# Patient Record
Sex: Female | Born: 1985 | Race: Black or African American | Hispanic: No | Marital: Single | Smoking: Never smoker
Health system: Southern US, Community
[De-identification: ages and names within clinical notes are randomized; demographics above are authoritative.]

## PROBLEM LIST (undated history)

## (undated) DIAGNOSIS — J45909 Unspecified asthma, uncomplicated: Secondary | ICD-10-CM

## (undated) DIAGNOSIS — T7840XA Allergy, unspecified, initial encounter: Secondary | ICD-10-CM

## (undated) HISTORY — PX: OTHER SURGICAL HISTORY: SHX169

## (undated) HISTORY — DX: Allergy, unspecified, initial encounter: T78.40XA

## (undated) HISTORY — PX: HERNIA REPAIR: SHX51

## (undated) HISTORY — PX: TONSILLECTOMY: SUR1361

---

## 2008-07-24 DIAGNOSIS — L709 Acne, unspecified: Secondary | ICD-10-CM | POA: Insufficient documentation

## 2008-11-27 DIAGNOSIS — E559 Vitamin D deficiency, unspecified: Secondary | ICD-10-CM | POA: Insufficient documentation

## 2009-12-15 ENCOUNTER — Ambulatory Visit: Payer: Self-pay | Admitting: Unknown Physician Specialty

## 2011-09-23 LAB — LIPID PANEL
Cholesterol: 141 mg/dL (ref 0–200)
HDL: 71 mg/dL — AB (ref 35–70)
LDL Cholesterol: 59 mg/dL
Triglycerides: 57 mg/dL (ref 40–160)

## 2011-09-23 LAB — HEPATIC FUNCTION PANEL
ALK PHOS: 61 U/L (ref 25–125)
ALT: 16 U/L (ref 7–35)
AST: 19 U/L (ref 13–35)
BILIRUBIN, TOTAL: 0.5 mg/dL

## 2011-09-23 LAB — BASIC METABOLIC PANEL
BUN: 12 mg/dL (ref 4–21)
Creatinine: 0.8 mg/dL (ref <=1.1)
Glucose: 76 mg/dL
Potassium: 4.1 mmol/L (ref 3.4–5.3)
SODIUM: 139 mmol/L (ref 137–147)

## 2013-05-06 ENCOUNTER — Emergency Department (HOSPITAL_COMMUNITY): Payer: BC Managed Care – PPO

## 2013-05-06 ENCOUNTER — Encounter (HOSPITAL_COMMUNITY): Payer: Self-pay | Admitting: Emergency Medicine

## 2013-05-06 ENCOUNTER — Emergency Department (HOSPITAL_COMMUNITY)
Admission: EM | Admit: 2013-05-06 | Discharge: 2013-05-06 | Disposition: A | Discharge status: Home / Self Care | Payer: BC Managed Care – PPO | Attending: Emergency Medicine | Admitting: Emergency Medicine

## 2013-05-06 DIAGNOSIS — Y9389 Activity, other specified: Secondary | ICD-10-CM | POA: Insufficient documentation

## 2013-05-06 DIAGNOSIS — S46909A Unspecified injury of unspecified muscle, fascia and tendon at shoulder and upper arm level, unspecified arm, initial encounter: Secondary | ICD-10-CM | POA: Insufficient documentation

## 2013-05-06 DIAGNOSIS — M25512 Pain in left shoulder: Secondary | ICD-10-CM

## 2013-05-06 DIAGNOSIS — Y92410 Unspecified street and highway as the place of occurrence of the external cause: Secondary | ICD-10-CM | POA: Insufficient documentation

## 2013-05-06 DIAGNOSIS — J45909 Unspecified asthma, uncomplicated: Secondary | ICD-10-CM | POA: Insufficient documentation

## 2013-05-06 DIAGNOSIS — S4980XA Other specified injuries of shoulder and upper arm, unspecified arm, initial encounter: Secondary | ICD-10-CM | POA: Insufficient documentation

## 2013-05-06 HISTORY — DX: Unspecified asthma, uncomplicated: J45.909

## 2013-05-06 MED ORDER — IBUPROFEN 200 MG PO TABS
600.0000 mg | ORAL_TABLET | Freq: Once | ORAL | Status: AC
  Administered 2013-05-06: 600 mg via ORAL
  Filled 2013-05-06: qty 3

## 2013-05-06 MED ORDER — HYDROCODONE-ACETAMINOPHEN 5-325 MG PO TABS: 1.0000 | ORAL_TABLET | ORAL | Status: DC | PRN

## 2013-05-06 MED ORDER — OXYCODONE-ACETAMINOPHEN 5-325 MG PO TABS
1.0000 | ORAL_TABLET | Freq: Once | ORAL | Status: AC
  Administered 2013-05-06: 1 via ORAL
  Filled 2013-05-06: qty 1

## 2013-05-06 MED ORDER — IBUPROFEN 800 MG PO TABS: 800.0000 mg | ORAL_TABLET | Freq: Three times a day (TID) | ORAL | Status: DC

## 2013-05-06 NOTE — ED Provider Notes (Signed)
Medical screening examination/treatment/procedure(s) were performed by non-physician practitioner and as supervising physician I was immediately available for consultation/collaboration.  EKG Interpretation   None         Charles B. Sheldon, MD 05/06/13 1534 
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
## 2013-05-06 NOTE — Discharge Instructions (Signed)
Take the prescribed medication as directed for pain.  Wear arm sling for comfort as long as desired. Return to the ED for new or worsening symptoms.
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
## 2013-05-06 NOTE — ED Notes (Signed)
Ortho paged and responded. 

## 2013-05-09 ENCOUNTER — Emergency Department: Payer: Self-pay | Admitting: Internal Medicine

## 2014-10-29 ENCOUNTER — Emergency Department
Admission: EM | Admit: 2014-10-29 | Discharge: 2014-10-29 | Disposition: A | Discharge status: Home / Self Care | Payer: No Typology Code available for payment source | Attending: Student | Admitting: Student

## 2014-10-29 ENCOUNTER — Emergency Department: Payer: No Typology Code available for payment source

## 2014-10-29 ENCOUNTER — Encounter: Payer: Self-pay | Admitting: Medical Oncology

## 2014-10-29 DIAGNOSIS — Z79899 Other long term (current) drug therapy: Secondary | ICD-10-CM | POA: Diagnosis not present

## 2014-10-29 DIAGNOSIS — Y9389 Activity, other specified: Secondary | ICD-10-CM | POA: Diagnosis not present

## 2014-10-29 DIAGNOSIS — Y92410 Unspecified street and highway as the place of occurrence of the external cause: Secondary | ICD-10-CM | POA: Diagnosis not present

## 2014-10-29 DIAGNOSIS — S161XXA Strain of muscle, fascia and tendon at neck level, initial encounter: Secondary | ICD-10-CM | POA: Insufficient documentation

## 2014-10-29 DIAGNOSIS — S29019A Strain of muscle and tendon of unspecified wall of thorax, initial encounter: Secondary | ICD-10-CM | POA: Diagnosis not present

## 2014-10-29 DIAGNOSIS — Y998 Other external cause status: Secondary | ICD-10-CM | POA: Insufficient documentation

## 2014-10-29 DIAGNOSIS — T148XXA Other injury of unspecified body region, initial encounter: Secondary | ICD-10-CM

## 2014-10-29 DIAGNOSIS — S199XXA Unspecified injury of neck, initial encounter: Secondary | ICD-10-CM | POA: Diagnosis present

## 2014-10-29 MED ORDER — CYCLOBENZAPRINE HCL 10 MG PO TABS: 10.0000 mg | ORAL_TABLET | Freq: Three times a day (TID) | ORAL | Status: DC | PRN

## 2014-10-29 MED ORDER — IBUPROFEN 800 MG PO TABS: 800.0000 mg | ORAL_TABLET | Freq: Three times a day (TID) | ORAL | Status: DC | PRN
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
## 2014-10-29 NOTE — ED Notes (Signed)
AAOx3.  Skin warm and dry.  NAD.  Family sitting at bedside.
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
## 2015-06-02 ENCOUNTER — Encounter: Payer: Self-pay | Admitting: Physician Assistant

## 2015-06-02 ENCOUNTER — Ambulatory Visit
Admission: RE | Admit: 2015-06-02 | Discharge: 2015-06-02 | Disposition: A | Discharge status: Home / Self Care | Payer: No Typology Code available for payment source | Source: Ambulatory Visit | Attending: Physician Assistant | Admitting: Physician Assistant

## 2015-06-02 ENCOUNTER — Ambulatory Visit (INDEPENDENT_AMBULATORY_CARE_PROVIDER_SITE_OTHER): Payer: PRIVATE HEALTH INSURANCE | Admitting: Physician Assistant

## 2015-06-02 VITALS — BP 120/60 | HR 97 | Temp 98.5°F | Resp 16 | Wt 189.2 lb

## 2015-06-02 DIAGNOSIS — N921 Excessive and frequent menstruation with irregular cycle: Secondary | ICD-10-CM | POA: Diagnosis present

## 2015-06-02 DIAGNOSIS — N926 Irregular menstruation, unspecified: Secondary | ICD-10-CM

## 2015-06-02 DIAGNOSIS — J309 Allergic rhinitis, unspecified: Secondary | ICD-10-CM | POA: Insufficient documentation

## 2015-06-02 DIAGNOSIS — N946 Dysmenorrhea, unspecified: Secondary | ICD-10-CM

## 2015-06-02 DIAGNOSIS — J45909 Unspecified asthma, uncomplicated: Secondary | ICD-10-CM | POA: Insufficient documentation

## 2015-06-02 DIAGNOSIS — Z309 Encounter for contraceptive management, unspecified: Secondary | ICD-10-CM | POA: Insufficient documentation

## 2015-06-02 NOTE — Patient Instructions (Addendum)
Dysmenorrhea Menstrual cramps (dysmenorrhea) are caused by the muscles of the uterus tightening (contracting) during a menstrual period. For some women, this discomfort is merely bothersome. For others, dysmenorrhea can be severe enough to interfere with everyday activities for a few days each month. Primary dysmenorrhea is menstrual cramps that last a couple of days when you start having menstrual periods or soon after. This often begins after a teenager starts having her period. As a woman gets older or has a baby, the cramps will usually lessen or disappear. Secondary dysmenorrhea begins later in life, lasts longer, and the pain may be stronger than primary dysmenorrhea. The pain may start before the period and last a few days after the period.  CAUSES  Dysmenorrhea is usually caused by an underlying problem, such as:  The tissue lining the uterus grows outside of the uterus in other areas of the body (endometriosis).  The endometrial tissue, which normally lines the uterus, is found in or grows into the muscular walls of the uterus (adenomyosis).  The pelvic blood vessels are engorged with blood just before the menstrual period (pelvic congestive syndrome).  Overgrowth of cells (polyps) in the lining of the uterus or cervix.  Falling down of the uterus (prolapse) because of loose or stretched ligaments.  Depression.  Bladder problems, infection, or inflammation.  Problems with the intestine, a tumor, or irritable bowel syndrome.  Cancer of the female organs or bladder.  A severely tipped uterus.  A very tight opening or closed cervix.  Noncancerous tumors of the uterus (fibroids).  Pelvic inflammatory disease (PID).  Pelvic scarring (adhesions) from a previous surgery.  Ovarian cyst.  An intrauterine device (IUD) used for birth control. RISK FACTORS You may be at greater risk of dysmenorrhea if:  You are younger than age 62.  You started puberty early.  You have  irregular or heavy bleeding.  You have never given birth.  You have a family history of this problem.  You are a smoker. SIGNS AND SYMPTOMS   Cramping or throbbing pain in your lower abdomen.  Headaches.  Lower back pain.  Nausea or vomiting.  Diarrhea.  Sweating or dizziness.  Loose stools. DIAGNOSIS  A diagnosis is based on your history, symptoms, physical exam, diagnostic tests, or procedures. Diagnostic tests or procedures may include:  Blood tests.  Ultrasonography.  An examination of the lining of the uterus (dilation and curettage, D&C).  An examination inside your abdomen or pelvis with a scope (laparoscopy).  X-rays.  CT scan.  MRI.  An examination inside the bladder with a scope (cystoscopy).  An examination inside the intestine or stomach with a scope (colonoscopy, gastroscopy). TREATMENT  Treatment depends on the cause of the dysmenorrhea. Treatment may include:  Pain medicine prescribed by your health care provider.  Birth control pills or an IUD with progesterone hormone in it.  Hormone replacement therapy.  Nonsteroidal anti-inflammatory drugs (NSAIDs). These may help stop the production of prostaglandins.  Surgery to remove adhesions, endometriosis, ovarian cyst, or fibroids.  Removal of the uterus (hysterectomy).  Progesterone shots to stop the menstrual period.  Cutting the nerves on the sacrum that go to the female organs (presacral neurectomy).  Electric current to the sacral nerves (sacral nerve stimulation).  Antidepressant medicine.  Psychiatric therapy, counseling, or group therapy.  Exercise and physical therapy.  Meditation and yoga therapy.  Acupuncture. HOME CARE INSTRUCTIONS   Only take over-the-counter or prescription medicines as directed by your health care provider.  Place a heating pad  or hot water bottle on your lower back or abdomen. Do not sleep with the heating pad.  Use aerobic exercises, walking,  swimming, biking, and other exercises to help lessen the cramping.  Massage to the lower back or abdomen may help.  Stop smoking.  Avoid alcohol and caffeine. SEEK MEDICAL CARE IF:   Your pain does not get better with medicine.  You have pain with sexual intercourse.  Your pain increases and is not controlled with medicines.  You have abnormal vaginal bleeding with your period.  You develop nausea or vomiting with your period that is not controlled with medicine. SEEK IMMEDIATE MEDICAL CARE IF:  You pass out.    This information is not intended to replace advice given to you by your health care provider. Make sure you discuss any questions you have with your health care provider.   Document Released: 03/14/2005 Document Revised: 11/14/2012 Document Reviewed: 08/30/2012 Elsevier Interactive Patient Education 2016 [employer]. Menorrhagia Menorrhagia is the medical term for when your menstrual periods are heavy or last longer than usual. With menorrhagia, every period you have may cause enough blood loss and cramping that you are unable to maintain your usual activities. CAUSES  In some cases, the cause of heavy periods is unknown, but a number of conditions may cause menorrhagia. Common causes include:  A problem with the hormone-producing thyroid gland (hypothyroid).  Noncancerous growths in the uterus (polyps or fibroids).  An imbalance of the estrogen and progesterone hormones.  One of your ovaries not releasing an egg during one or more months.  Side effects of having an intrauterine device (IUD).  Side effects of some medicines, such as anti-inflammatory medicines or blood thinners.  A bleeding disorder that stops your blood from clotting normally. SIGNS AND SYMPTOMS  During a normal period, bleeding lasts between 4 and 8 days. Signs that your periods are too heavy include:  You routinely have to change your pad or tampon every 1 or 2 hours because it is completely  soaked.  You pass blood clots larger than 1 inch (2.5 cm) in size.  You have bleeding for more than 7 days.  You need to use pads and tampons at the same time because of heavy bleeding.  You need to wake up to change your pads or tampons during the night.  You have symptoms of anemia, such as tiredness, fatigue, or shortness of breath. DIAGNOSIS  Your health care provider will perform a physical exam and ask you questions about your symptoms and menstrual history. Other tests may be ordered based on what the health care provider finds during the exam. These tests can include:  Blood tests. Blood tests are used to check if you are pregnant or have hormonal changes, a bleeding or thyroid disorder, low iron levels (anemia), or other problems.  Endometrial biopsy. Your health care provider takes a sample of tissue from the inside of your uterus to be examined under a microscope.  Pelvic ultrasound. This test uses sound waves to make a picture of your uterus, ovaries, and vagina. The pictures can show if you have fibroids or other growths.  Hysteroscopy. For this test, your health care provider will use a small telescope to look inside your uterus. Based on the results of your initial tests, your health care provider may recommend further testing. TREATMENT  Treatment may not be needed. If it is needed, your health care provider may recommend treatment with one or more medicines first. If these do not reduce  bleeding enough, a surgical treatment might be an option. The best treatment for you will depend on:   Whether you need to prevent pregnancy.  Your desire to have children in the future.  The cause and severity of your bleeding.  Your opinion and personal preference.  Medicines for menorrhagia may include:  Birth control methods that use hormones. These include the pill, skin patch, vaginal ring, shots that you get every 3 months, hormonal IUD, and implant. These treatments  reduce bleeding during your menstrual period.  Medicines that thicken blood and slow bleeding.  Medicines that reduce swelling, such as ibuprofen.  Medicines that contain a synthetic hormone called progestin.   Medicines that make the ovaries stop working for a short time.  You may need surgical treatment for menorrhagia if the medicines are unsuccessful. Treatment options include:  Dilation and curettage (D&C). In this procedure, your health care provider opens (dilates) your cervix and then scrapes or suctions tissue from the lining of your uterus to reduce menstrual bleeding.  Operative hysteroscopy. This procedure uses a tiny tube with a light (hysteroscope) to view your uterine cavity and can help in the surgical removal of a polyp that may be causing heavy periods.  Endometrial ablation. Through various techniques, your health care provider permanently destroys the entire lining of your uterus (endometrium). After endometrial ablation, most women have little or no menstrual flow. Endometrial ablation reduces your ability to become pregnant.  Endometrial resection. This surgical procedure uses an electrosurgical wire loop to remove the lining of the uterus. This procedure also reduces your ability to become pregnant.  Hysterectomy. Surgical removal of the uterus and cervix is a permanent procedure that stops menstrual periods. Pregnancy is not possible after a hysterectomy. This procedure requires anesthesia and hospitalization. HOME CARE INSTRUCTIONS   Only take over-the-counter or prescription medicines as directed by your health care provider. Take prescribed medicines exactly as directed. Do not change or switch medicines without consulting your health care provider.  Take any prescribed iron pills exactly as directed by your health care provider. Long-term heavy bleeding may result in low iron levels. Iron pills help replace the iron your body lost from heavy bleeding. Iron may  cause constipation. If this becomes a problem, increase the bran, fruits, and roughage in your diet.  Do not take aspirin or medicines that contain aspirin 1 week before or during your menstrual period. Aspirin may make the bleeding worse.  If you need to change your sanitary pad or tampon more than once every 2 hours, stay in bed and rest as much as possible until the bleeding stops.  Eat well-balanced meals. Eat foods high in iron. Examples are leafy green vegetables, meat, liver, eggs, and whole grain breads and cereals. Do not try to lose weight until the abnormal bleeding has stopped and your blood iron level is back to normal. SEEK MEDICAL CARE IF:   You soak through a pad or tampon every 1 or 2 hours, and this happens every time you have a period.  You need to use pads and tampons at the same time because you are bleeding so much.  You need to change your pad or tampon during the night.  You have a period that lasts for more than 8 days.  You pass clots bigger than 1 inch wide.  You have irregular periods that happen more or less often than once a month.  You feel dizzy or faint.  You feel very weak or tired.  You  feel short of breath or feel your heart is beating too fast when you exercise.  You have nausea and vomiting or diarrhea while you are taking your medicine.  You have any problems that may be related to the medicine you are taking. SEEK IMMEDIATE MEDICAL CARE IF:   You soak through 4 or more pads or tampons in 2 hours.  You have any bleeding while you are pregnant. MAKE SURE YOU:   Understand these instructions.  Will watch your condition.  Will get help right away if you are not doing well or get worse.   This information is not intended to replace advice given to you by your health care provider. Make sure you discuss any questions you have with your health care provider.   Document Released: 03/14/2005 Document Revised: 03/19/2013 Document Reviewed:  09/02/2012 Elsevier Interactive Patient Education 2016 Elsevier Inc. Uterine Fibroids Uterine fibroids are tissue masses (tumors) that can develop in the womb (uterus). They are also called leiomyomas. This type of tumor is not cancerous (benign) and does not spread to other parts of the body outside of the pelvic area, which is between the hip bones. Occasionally, fibroids may develop in the fallopian tubes, in the cervix, or on the support structures (ligaments) that surround the uterus. You can have one or many fibroids. Fibroids can vary in size, weight, and where they grow in the uterus. Some can become quite large. Most fibroids do not require medical treatment. CAUSES A fibroid can develop when a single uterine cell keeps growing (replicating). Most cells in the human body have a control mechanism that keeps them from replicating without control. SIGNS AND SYMPTOMS Symptoms may include:   Heavy bleeding during your period.  Bleeding or spotting between periods.  Pelvic pain and pressure.  Bladder problems, such as needing to urinate more often (urinary frequency) or urgently.  Inability to reproduce offspring (infertility).  Miscarriages. DIAGNOSIS Uterine fibroids are diagnosed through a physical exam. Your health care provider may feel the lumpy tumors during a pelvic exam. Ultrasonography and an MRI may be done to determine the size, location, and number of fibroids. TREATMENT Treatment may include:  Watchful waiting. This involves getting the fibroid checked by your health care provider to see if it grows or shrinks. Follow your health care provider's recommendations for how often to have this checked.  Hormone medicines. These can be taken by mouth or given through an intrauterine device (IUD).  Surgery.  Removing the fibroids (myomectomy) or the uterus (hysterectomy).  Removing blood supply to the fibroids (uterine artery embolization). If fibroids interfere with your  fertility and you want to become pregnant, your health care provider may recommend having the fibroids removed.  HOME CARE INSTRUCTIONS  Keep all follow-up visits as directed by your health care provider. This is important.  Take medicines only as directed by your health care provider.  If you were prescribed a hormone treatment, take the hormone medicines exactly as directed.  Do not take aspirin, because it can cause bleeding.  Ask your health care provider about taking iron pills and increasing the amount of dark green, leafy vegetables in your diet. These actions can help to boost your blood iron levels, which may be affected by heavy menstrual bleeding.  Pay close attention to your period and tell your health care provider about any changes, such as:  Increased blood flow that requires you to use more pads or tampons than usual per month.  A change in the number  of days that your period lasts per month.  A change in symptoms that are associated with your period, such as abdominal cramping or back pain. SEEK MEDICAL CARE IF:  You have pelvic pain, back pain, or abdominal cramps that cannot be controlled with medicines.  You have an increase in bleeding between and during periods.  You soak tampons or pads in a half hour or less.  You feel lightheaded, extra tired, or weak. SEEK IMMEDIATE MEDICAL CARE IF:  You faint.  You have a sudden increase in pelvic pain.   This information is not intended to replace advice given to you by your health care provider. Make sure you discuss any questions you have with your health care provider.   Document Released: 03/11/2000 Document Revised: 04/04/2014 Document Reviewed: 09/10/2013 Elsevier Interactive Patient Education [employer]

## 2015-06-02 NOTE — Progress Notes (Addendum)
Patient: Erin Hudson Female    DOB: Jun 11, 1985   30 y.o.   MRN: [id number] Visit Date: 06/02/2015  Today's Provider: Margaretann Loveless, PA-C   Chief Complaint  Patient presents with  . Dysmenorrhea   Subjective:    HPI  Erin Hudson is a 30yr old with c/o spotting and cramping a lot during her menstrual cycle.She started her period on Friday and it is still ongoing. She is concern about the blood clots and the heavier it was this time. She went through 36 pads Friday-Sunday. She took extra strength Tylenol and Menstrual Pain Medication. She is not currently on any contraception.  She does have a personal history of abnormal menses but never like this.  She states it is not uncommon for her to miss 2-3 months at a time.  She has been seen at [facility] by Dr. [place] and Dr. Luella Cook.  She had an IUD placed and had it removed 3 weeks later due to cramping and abnormal menses. She then tried depo-provera but did not like the way it made her feel. She was then placed on OCPs but discontinued due to nausea.   She does have a positive family history of fibroids in her maternal aunt, who had a hysterectomy.  She also states her mother had similar issues and bled constantly and also had a hysterectomy (never diagnosed as fibroids).  She is tearful today as she is concerned she is going to have to have a hysterectomy and she wants children.    No Known Allergies Previous Medications   ALBUTEROL (PROVENTIL HFA;VENTOLIN HFA) 108 (90 BASE) MCG/ACT INHALER    Inhale 1 puff into the lungs every 6 (six) hours as needed for wheezing or shortness of breath.   ALBUTEROL (PROVENTIL) (2.5 MG/3ML) 0.083% NEBULIZER SOLUTION    Take 2.5 mg by nebulization every 6 (six) hours as needed for wheezing or shortness of breath.    Review of Systems  Constitutional: Negative.   HENT: Negative.   Respiratory: Negative.   Cardiovascular: Negative.   Gastrointestinal: Positive for abdominal pain (Lower  abdomen, mainly the left side.). Negative for nausea, vomiting, diarrhea, constipation, blood in stool and anal bleeding.  Genitourinary: Positive for vaginal bleeding, menstrual problem and pelvic pain. Negative for dysuria, frequency, hematuria, flank pain, vaginal discharge, enuresis, genital sores, vaginal pain and dyspareunia.  Musculoskeletal: Positive for back pain. Negative for myalgias, joint swelling, arthralgias and gait problem.  Neurological: Negative.     Social History  Substance Use Topics  . Smoking status: Never Smoker   . Smokeless tobacco: Not on file  . Alcohol Use: Yes     Comment: OCC   Objective:   BP 120/60 mmHg  Pulse 97  Temp(Src) 98.5 F (36.9 C) (Oral)  Resp 16  Wt 189 lb 3.2 oz (85.821 kg)  LMP 06/01/2015  Physical Exam  Constitutional: She is oriented to person, place, and time. She appears well-developed and well-nourished. No distress.  Cardiovascular: Normal rate, regular rhythm and normal heart sounds.  Exam reveals no gallop and no friction rub.   No murmur heard. Pulmonary/Chest: Effort normal and breath sounds normal. No respiratory distress. She has no wheezes. She has no rales.  Abdominal: Soft. Normal appearance and bowel sounds are normal. She exhibits no distension and no mass. There is no hepatosplenomegaly. There is tenderness in the suprapubic area, left upper quadrant and left lower quadrant. There is no rebound, no guarding and no CVA tenderness.  Neurological: She is alert and oriented to person, place, and time.  Skin: Skin is warm and dry. She is not diaphoretic.        Assessment & Plan:     1. Dysmenorrhea Will obtain US as below to R/O fibroids vs cyst.  Will f/u pending US results.  May consider adding OCP back depending on results and following up in 4 weeks.  If severely abnormal may consider referral to OB/GYN instead.  Also discussed that if it is fibroids there are alternative measures for treatment that do not involve  hysterectomy to preserve fertility. This helped calm her down as well. - US Transvaginal Non-OB; Future - US Pelvis Complete; Future  2. Irregular menstrual cycle See above medical treatment plan. - US Transvaginal Non-OB; Future - US Pelvis Complete; Future  3. Menorrhagia with irregular cycle See above medical treatment plan. - US Transvaginal Non-OB; Future - US Pelvis Complete; Future  *ADDENDUM: Pelvic and transvaginal US were negative for ovarian cyst or fibroids.  However, there was a slight enlargement of endometrium measuring 10mm.  Will add OCP and see her back in 4 weeks to see if any improvement noted.  If not, will refer to OB/GYN.      Margaretann LovelessJennifer M Luise Yamamoto, PA-C  [facility]

## 2015-06-03 ENCOUNTER — Telehealth: Payer: Self-pay

## 2015-06-03 ENCOUNTER — Ambulatory Visit: Payer: Self-pay | Admitting: Physician Assistant

## 2015-06-03 MED ORDER — NORETHIN ACE-ETH ESTRAD-FE 1-20 MG-MCG(24) PO TABS: 1.0000 | ORAL_TABLET | Freq: Every day | ORAL | Status: DC

## 2015-06-03 NOTE — Addendum Note (Signed)
Addended by: Margaretann LovelessBURNETTE, Saafir Abdullah M on: 06/03/2015 08:29 AM   Modules accepted: Orders

## 2015-06-03 NOTE — Telephone Encounter (Signed)
-----   Message from Margaretann LovelessJennifer M Burnette, PA-C sent at 06/03/2015  8:27 AM EST ----- Mild endometrial (lining of the uterus) thickening.  Could be secondary to menstrual cycle as this could be a normal finding during menstruation. No fibroids or ovarian cyst noted. Will add oral contraceptive and f/u in 4 weeks with me to see if any improvement with oral contraceptive.

## 2015-06-03 NOTE — Telephone Encounter (Signed)
Patient advised as directed below.  Scheduled her 4 week f/u appointment.  Thanks,  -Dmarco Baldus

## 2015-06-30 ENCOUNTER — Ambulatory Visit: Payer: Self-pay | Admitting: Physician Assistant

## 2015-12-01 ENCOUNTER — Ambulatory Visit (INDEPENDENT_AMBULATORY_CARE_PROVIDER_SITE_OTHER): Payer: PRIVATE HEALTH INSURANCE | Admitting: Family Medicine

## 2015-12-01 ENCOUNTER — Encounter: Payer: Self-pay | Admitting: Family Medicine

## 2015-12-01 VITALS — BP 140/84 | HR 60 | Temp 98.0°F | Resp 16 | Wt 196.0 lb

## 2015-12-01 DIAGNOSIS — J029 Acute pharyngitis, unspecified: Secondary | ICD-10-CM

## 2015-12-01 DIAGNOSIS — R05 Cough: Secondary | ICD-10-CM | POA: Diagnosis not present

## 2015-12-01 DIAGNOSIS — R059 Cough, unspecified: Secondary | ICD-10-CM

## 2015-12-01 MED ORDER — AZITHROMYCIN 250 MG PO TABS: ORAL_TABLET | ORAL | 0 refills | Status: DC

## 2015-12-01 MED ORDER — HYDROCODONE-HOMATROPINE 5-1.5 MG/5ML PO SYRP: 5.0000 mL | ORAL_SOLUTION | Freq: Three times a day (TID) | ORAL | 0 refills | Status: DC | PRN

## 2015-12-01 NOTE — Progress Notes (Signed)
Erin Hudson  MRN: [id number] DOB: 06/22/1985  Subjective:  HPI   The patient is a 30 year old female who presents for evaluation of sinus congestion, cough and vomiting.  She states that 1 week ago she began having sinus congestion and ear pain.  On Friday, 4 days ago it worsened.  She did however go out Friday night and had a lot to drink.  On Saturday she had vomiting about all day.  She states it may be from the alcohol, maybe from alcohol while taking Mucinex or just from being sick.  She has not however had any more vomiting since Saturday.  Her cough has gotten worse.  It is non productive.  She has been using the Albuterol inhaler but not her nebulilzer.  She denies any fever.    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  Patient Active Problem List   Diagnosis Date Noted  . Allergic rhinitis 06/02/2015  . AB (asthmatic bronchitis) 06/02/2015  . Contraception management 06/02/2015  . Dysmenorrhea 06/02/2015  . Irregular menstrual cycle 06/02/2015  . Vitamin D deficiency 11/27/2008  . Acne 07/24/2008  . Pityriasis rosea 07/24/2008  . CD (contact dermatitis) 07/16/2008    Past Medical History:  Diagnosis Date  . Asthma     Social History   Social History  . Marital status: Single    Spouse name: N/A  . Number of children: N/A  . Years of education: N/A   Occupational History  . Not on file.   Social History Main Topics  . Smoking status: Never Smoker  . Smokeless tobacco: Not on file  . Alcohol use Yes     Comment: OCC  . Drug use: No  . Sexual activity: Yes    Birth control/ protection: None   Other Topics Concern  . Not on file   Social History Narrative  . No narrative on file    Outpatient Encounter Prescriptions as of 12/01/2015  Medication Sig  . albuterol (PROVENTIL HFA;VENTOLIN HFA) 108 (90 BASE) MCG/ACT inhaler Inhale 1 puff into the lungs every 6 (six) hours as needed for wheezing or shortness of breath.  Marland Kitchen. albuterol  (PROVENTIL) (2.5 MG/3ML) 0.083% nebulizer solution Take 2.5 mg by nebulization every 6 (six) hours as needed for wheezing or shortness of breath.  . Norethindrone Acetate-Ethinyl Estrad-FE (LOESTRIN 24 FE) 1-20 MG-MCG(24) tablet Take 1 tablet by mouth daily.   No facility-administered encounter medications on file as of 12/01/2015.     No Known Allergies  Review of Systems  Constitutional: Positive for chills (Sweats) and malaise/fatigue. Negative for fever.  HENT: Positive for congestion, ear pain, sore throat (secondary to vomiting.) and tinnitus. Negative for ear discharge, hearing loss and nosebleeds.   Eyes: Positive for redness. Negative for blurred vision, double vision, photophobia, pain and discharge.  Respiratory: Positive for cough. Negative for hemoptysis, sputum production, shortness of breath and wheezing.   Cardiovascular: Positive for orthopnea (Patient has been sleeping propped up on the sofa). Negative for chest pain, palpitations, claudication, leg swelling and PND.  Gastrointestinal: Negative for abdominal pain, constipation, diarrhea, heartburn, nausea and vomiting.  Neurological: Positive for weakness. Negative for headaches.   Objective:  BP 140/84   Pulse 60   Temp 98 F (36.7 C) (Oral)   Resp 16   Wt 196 lb (88.9 kg)   BMI 28.12 kg/m   Physical Exam  Constitutional: She is oriented to person, place, and time and well-developed,  well-nourished, and in no distress.  HENT:  Head: Normocephalic and atraumatic.  Right Ear: External ear normal.  Left Ear: External ear normal.  Nose: Nose normal.  Mouth/Throat: Oropharynx is clear and moist.  Eyes: Conjunctivae and EOM are normal. Pupils are equal, round, and reactive to light.  Neck: Normal range of motion. Neck supple.  Cardiovascular: Normal rate, regular rhythm, normal heart sounds and intact distal pulses.   Pulmonary/Chest: Effort normal and breath sounds normal.  Abdominal: Soft. Bowel sounds are normal.    Neurological: She is alert and oriented to person, place, and time. Gait normal.  Skin: Skin is warm and dry.  Psychiatric: Mood, memory, affect and judgment normal.    Assessment and Plan :    1. Pharyngitis  - azithromycin (ZITHROMAX) 250 MG tablet; 2 PO on day 1, then 1 po daily until gone.  Dispense: 6 tablet; Refill: 0 - HYDROcodone-homatropine (HYCODAN) 5-1.5 MG/5ML syrup; Take 5 mLs by mouth every 8 (eight) hours as needed for cough.  Dispense: 120 mL; Refill: 0  2. Cough  - azithromycin (ZITHROMAX) 250 MG tablet; 2 PO on day 1, then 1 po daily until gone.  Dispense: 6 tablet; Refill: 0 - HYDROcodone-homatropine (HYCODAN) 5-1.5 MG/5ML syrup; Take 5 mLs by mouth every 8 (eight) hours as needed for cough.  Dispense: 120 mL; Refill: 0 3.URI HPI, Exam and A&P Transcribed under the direction and in the presence of Megan Mans., MD. Electronically Signed: Janey Greaser, RMA

## 2015-12-28 ENCOUNTER — Encounter: Payer: Self-pay | Admitting: Family Medicine

## 2015-12-28 ENCOUNTER — Ambulatory Visit (INDEPENDENT_AMBULATORY_CARE_PROVIDER_SITE_OTHER): Payer: PRIVATE HEALTH INSURANCE | Admitting: Family Medicine

## 2015-12-28 VITALS — BP 120/68 | HR 97 | Temp 98.0°F | Resp 16 | Wt 199.0 lb

## 2015-12-28 DIAGNOSIS — J069 Acute upper respiratory infection, unspecified: Secondary | ICD-10-CM | POA: Diagnosis not present

## 2015-12-28 DIAGNOSIS — J45909 Unspecified asthma, uncomplicated: Secondary | ICD-10-CM | POA: Diagnosis not present

## 2015-12-28 DIAGNOSIS — R05 Cough: Secondary | ICD-10-CM

## 2015-12-28 DIAGNOSIS — R059 Cough, unspecified: Secondary | ICD-10-CM

## 2015-12-28 MED ORDER — AZITHROMYCIN 250 MG PO TABS: ORAL_TABLET | ORAL | 0 refills | Status: AC

## 2015-12-28 MED ORDER — ALBUTEROL SULFATE (2.5 MG/3ML) 0.083% IN NEBU: 2.5000 mg | INHALATION_SOLUTION | Freq: Four times a day (QID) | RESPIRATORY_TRACT | 2 refills | Status: DC | PRN

## 2015-12-28 MED ORDER — ALBUTEROL SULFATE HFA 108 (90 BASE) MCG/ACT IN AERS: 1.0000 | INHALATION_SPRAY | Freq: Four times a day (QID) | RESPIRATORY_TRACT | 3 refills | Status: DC | PRN

## 2015-12-28 NOTE — Progress Notes (Signed)
Patient: Erin Hudson Female    DOB: 10/14/1985   30 y.o.   MRN: [id number] Visit Date: 12/28/2015  Today's Provider: Mila Merryonald Karyssa Amaral, MD   Chief Complaint  Patient presents with  . URI   Subjective:    URI   This is a new problem. The current episode started in the past 7 days (x 3 days). The problem has been gradually worsening. There has been no fever. Associated symptoms include chest pain, congestion, coughing (dry), ear pain (bilateral), headaches, a plugged ear sensation, rhinorrhea, sinus pain, sneezing and a sore throat. Pertinent negatives include no abdominal pain, diarrhea, nausea, vomiting or wheezing. She has tried inhaler use (Hycodan, Advil Cold and Sinus, antihistamine) for the symptoms. The treatment provided no relief.       No Known Allergies   Current Outpatient Prescriptions:  .  albuterol (PROVENTIL HFA;VENTOLIN HFA) 108 (90 BASE) MCG/ACT inhaler, Inhale 1 puff into the lungs every 6 (six) hours as needed for wheezing or shortness of breath., Disp: , Rfl:  .  albuterol (PROVENTIL) (2.5 MG/3ML) 0.083% nebulizer solution, Take 2.5 mg by nebulization every 6 (six) hours as needed for wheezing or shortness of breath., Disp: , Rfl:  .  HYDROcodone-homatropine (HYCODAN) 5-1.5 MG/5ML syrup, Take 5 mLs by mouth every 8 (eight) hours as needed for cough., Disp: 120 mL, Rfl: 0 .  Norethindrone Acetate-Ethinyl Estrad-FE (LOESTRIN 24 FE) 1-20 MG-MCG(24) tablet, Take 1 tablet by mouth daily., Disp: 1 Package, Rfl: 3  Review of Systems  HENT: Positive for congestion, ear pain (bilateral), rhinorrhea, sneezing and sore throat.   Respiratory: Positive for cough (dry). Negative for wheezing.   Cardiovascular: Positive for chest pain.  Gastrointestinal: Negative for abdominal pain, diarrhea, nausea and vomiting.  Neurological: Positive for headaches.    Social History  Substance Use Topics  . Smoking status: Never Smoker  . Smokeless tobacco: Not on file  . Alcohol  use Yes     Comment: OCC   Objective:   BP 120/68 (BP Location: Left Arm, Patient Position: Sitting, Cuff Size: Normal)   Pulse 97   Temp 98 F (36.7 C) (Oral)   Resp 16   Wt 199 lb (90.3 kg)   LMP 12/13/2015   SpO2 99%   BMI 28.55 kg/m   Physical Exam  General Appearance:    Alert, cooperative, no distress  HENT:   bilateral TM normal without fluid or infection, neck without nodes, neck has bilateral anterior cervical nodes enlarged, throat normal without erythema or exudate, sinuses nontender and nasal mucosa pale and congested  Eyes:    PERRL, conjunctiva/corneas clear, EOM's intact       Lungs:     Rare expiratory wheeze, no rales, respirations unlabored  Heart:    Regular rate and rhythm  Neurologic:   Awake, alert, oriented x 3. No apparent focal neurological           defect.           Assessment & Plan:     1. Cough  - albuterol (PROVENTIL HFA;VENTOLIN HFA) 108 (90 Base) MCG/ACT inhaler; Inhale 1 puff into the lungs every 6 (six) hours as needed for wheezing or shortness of breath.  Dispense: 1 Inhaler; Refill: 3  2. Upper respiratory tract infection, unspecified type   3. Asthmatic bronchitis without complication, unspecified asthma severity, unspecified whether persistent  - albuterol (PROVENTIL HFA;VENTOLIN HFA) 108 (90 Base) MCG/ACT inhaler; Inhale 1 puff into the lungs every 6 (six)  hours as needed for wheezing or shortness of breath.  Dispense: 1 Inhaler; Refill: 3 - albuterol (PROVENTIL) (2.5 MG/3ML) 0.083% nebulizer solution; Take 3 mLs (2.5 mg total) by nebulization every 6 (six) hours as needed for wheezing or shortness of breath.  Dispense: 75 mL; Refill: 2 - azithromycin (ZITHROMAX) 250 MG tablet; 2 by mouth today, then 1 daily for 4 days  Dispense: 6 tablet; Refill: 0  Call if symptoms change or if not rapidly improving.        The entirety of the information documented in the History of Present Illness, Review of Systems and Physical Exam were  personally obtained by me. Portions of this information were initially documented by Allene Dillon, CMA and reviewed by me for thoroughness and accuracy.     Mila Merry, MD  [facility]

## 2016-04-06 ENCOUNTER — Other Ambulatory Visit: Payer: Self-pay

## 2016-04-06 DIAGNOSIS — N926 Irregular menstruation, unspecified: Secondary | ICD-10-CM

## 2016-04-06 DIAGNOSIS — N921 Excessive and frequent menstruation with irregular cycle: Secondary | ICD-10-CM

## 2016-04-06 DIAGNOSIS — N946 Dysmenorrhea, unspecified: Secondary | ICD-10-CM

## 2016-04-06 MED ORDER — NORETHIN ACE-ETH ESTRAD-FE 1-20 MG-MCG(24) PO TABS: 1.0000 | ORAL_TABLET | Freq: Every day | ORAL | 11 refills | Status: DC

## 2016-04-06 NOTE — Telephone Encounter (Signed)
Yes - thank you

## 2016-04-06 NOTE — Telephone Encounter (Signed)
Per patient she needs refill on albuterol solution because she already went through the refills. She is also requesting refills on the Loestrin. Per Antony ContrasJenni patient needs an office visit to evaluate her breathing and to get PFT. She is aware that she has 3 more refills on the inhaler. Scheduled patient Wednesday January 17 at 1:30 pm.  Thanks,  -Joseline

## 2016-04-13 ENCOUNTER — Ambulatory Visit: Payer: Self-pay | Admitting: Physician Assistant
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
## 2016-08-06 ENCOUNTER — Other Ambulatory Visit: Payer: Self-pay | Admitting: Family Medicine

## 2016-08-06 DIAGNOSIS — J45909 Unspecified asthma, uncomplicated: Secondary | ICD-10-CM

## 2016-10-07 IMAGING — US US TRANSVAGINAL NON-OB
1 series · 13 of 25 positions shown · non-contrast
Comparison: None

CLINICAL DATA: Patient with dysmenorrhea. Left lower quadrant pain
for 5 days.

EXAM:
TRANSABDOMINAL AND TRANSVAGINAL ULTRASOUND OF PELVIS
TECHNIQUE: Both transabdominal and transvaginal ultrasound examinations of the
pelvis were performed. Transabdominal technique was performed for
global imaging of the pelvis including uterus, ovaries, adnexal
regions, and pelvic cul-de-sac. It was necessary to proceed with
endovaginal exam following the transabdominal exam to visualize the
endometrium and adnexal structures.

[Series 1: us transvaginal non-ob · 0.25mm/px · 13 of 77 slices shown]
[im 1/77]
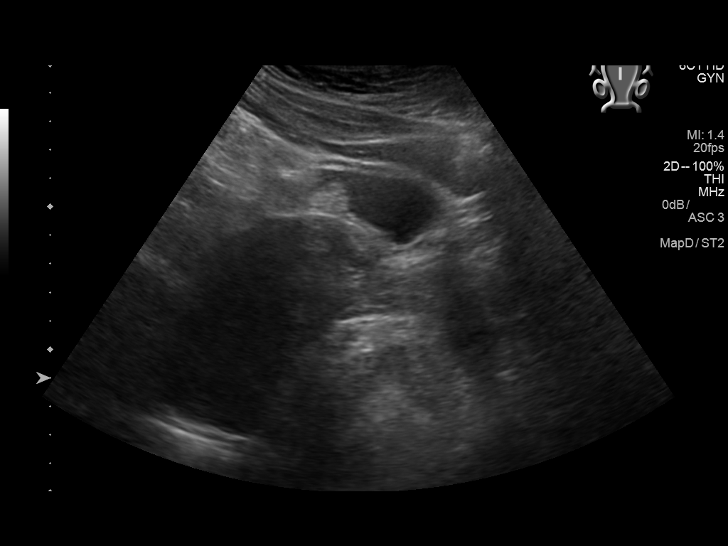
[im 7/77]
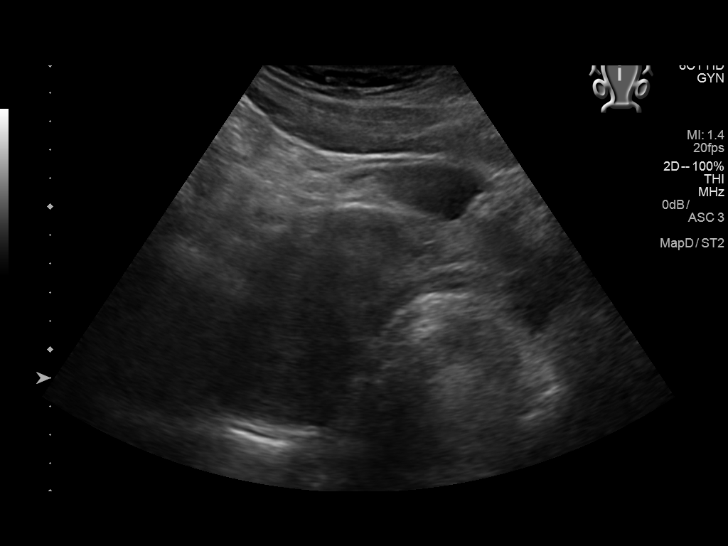
[im 13/77]
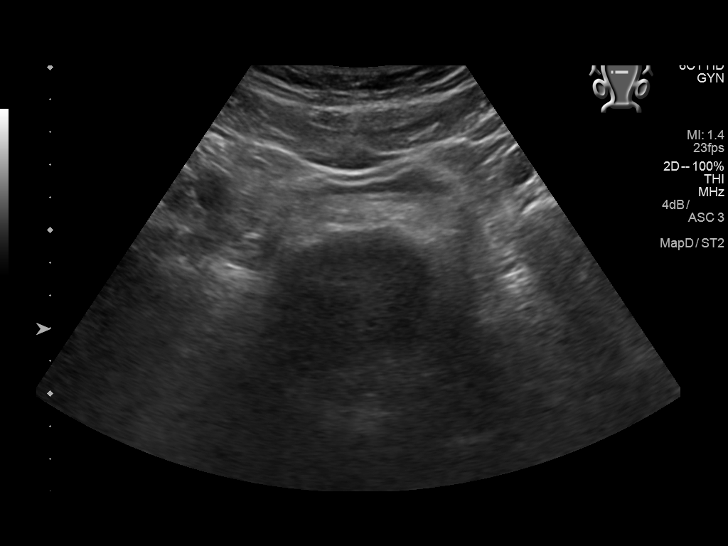
[im 20/77]
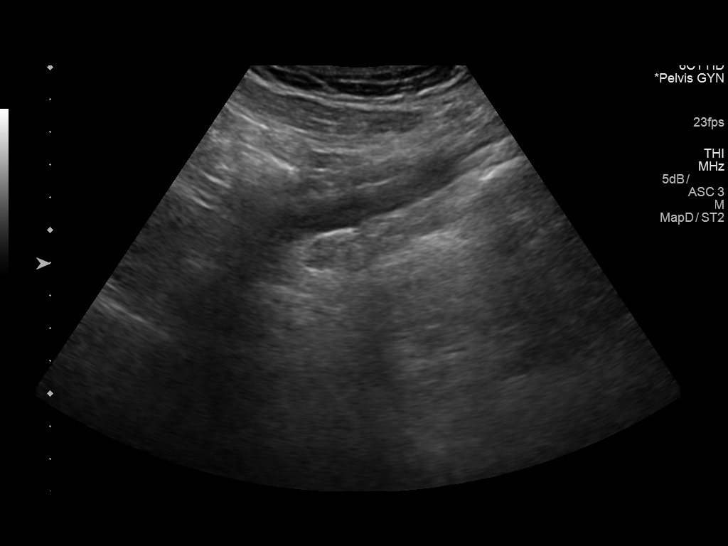
[im 26/77]
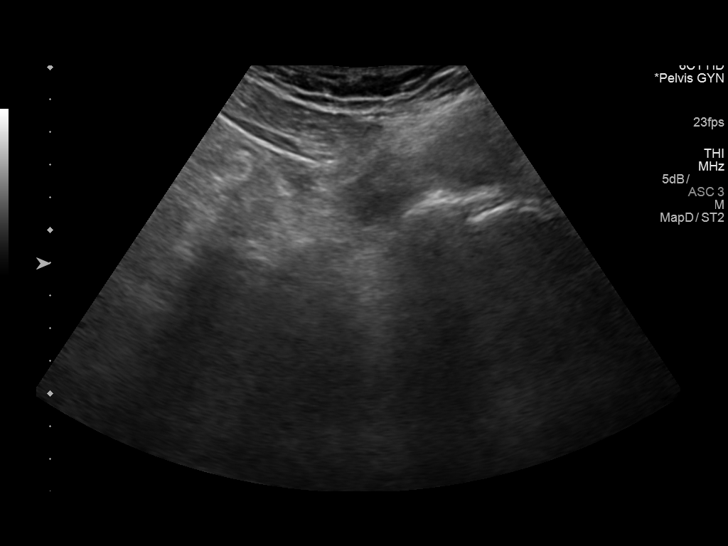
[im 32/77]
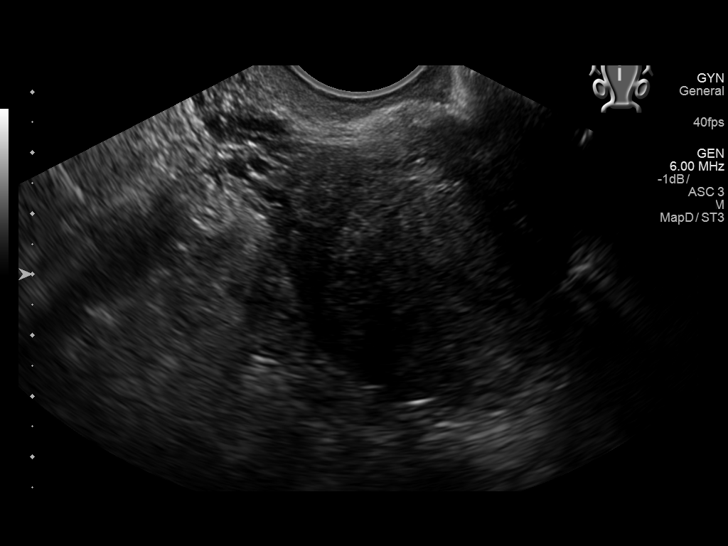
[im 39/77]
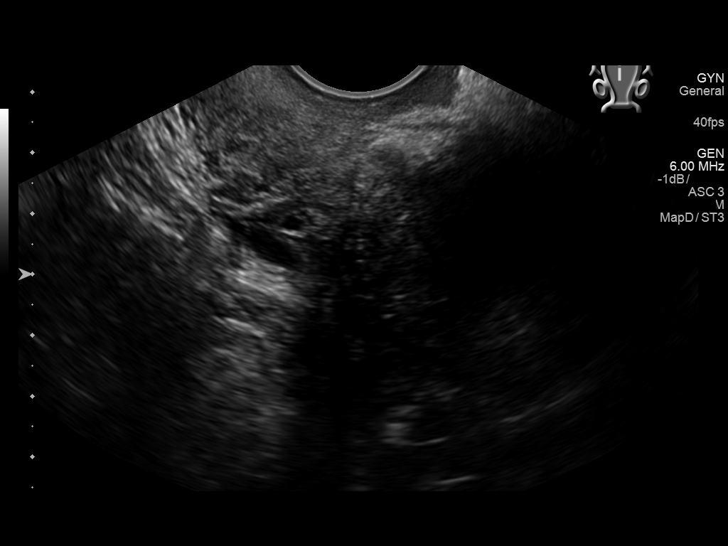
[im 45/77]
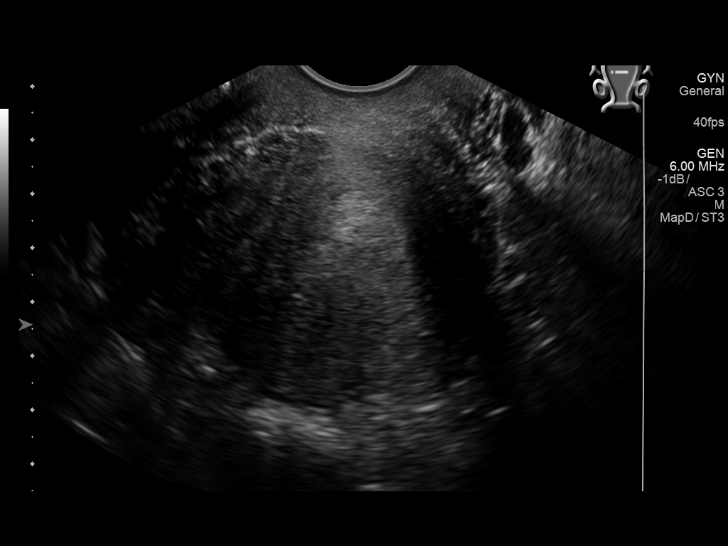
[im 51/77]
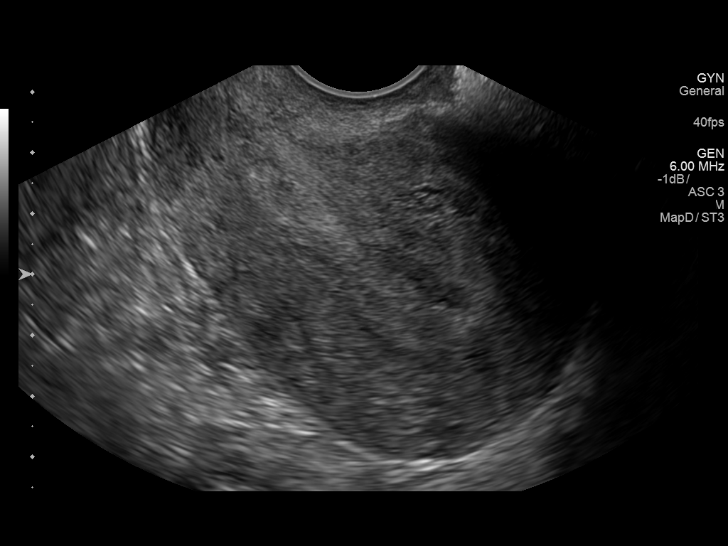
[im 58/77]
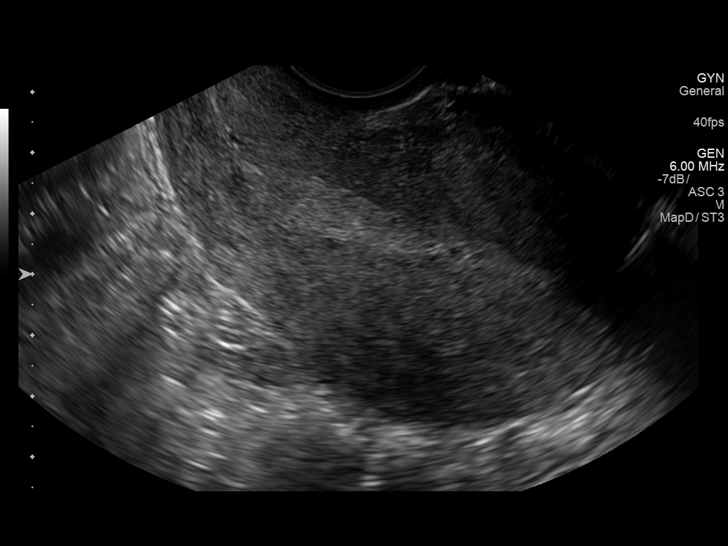
[im 64/77]
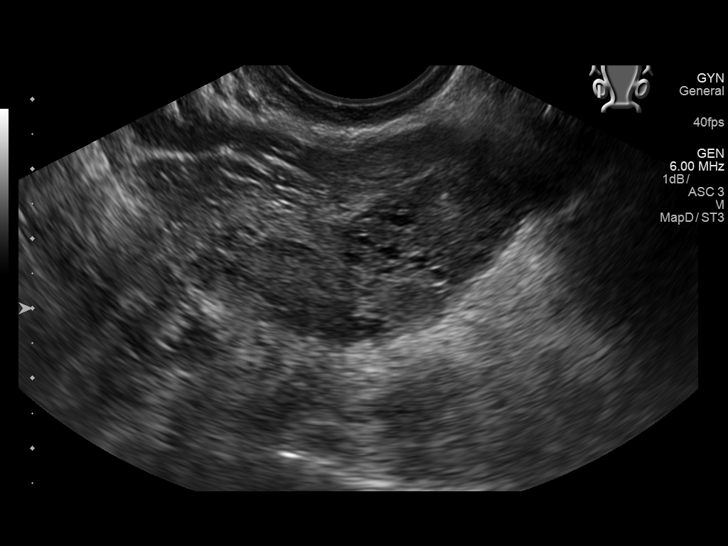
[im 70/77]
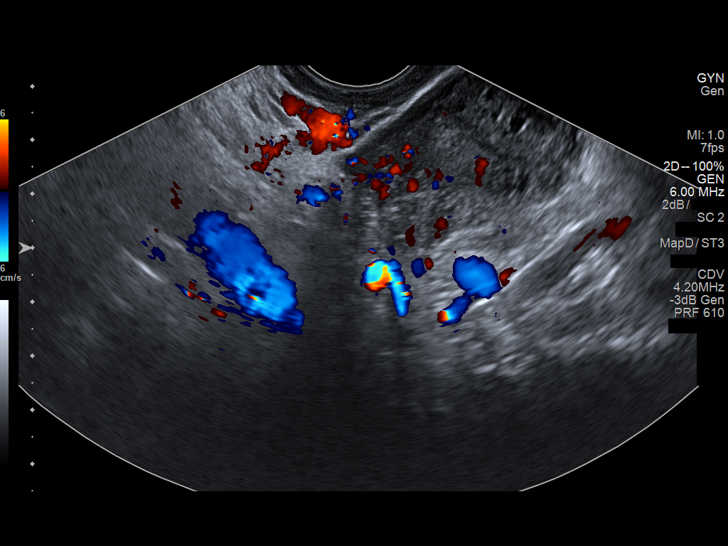
[im 77/77]
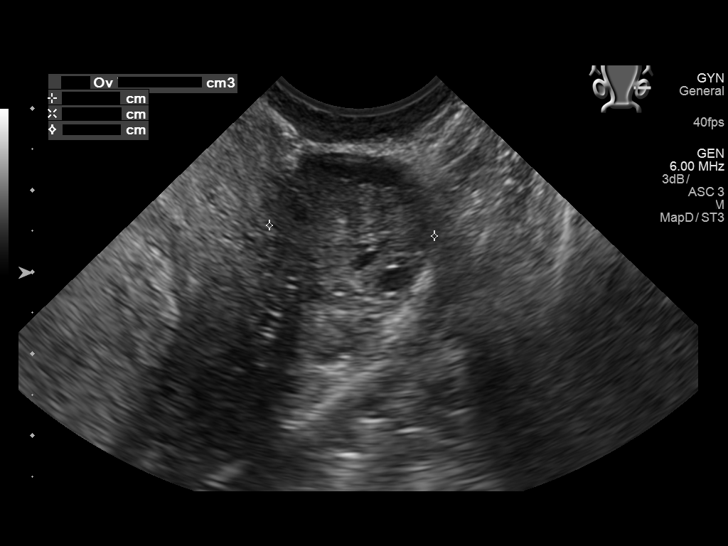

[13 of 25 positions shown; findings below may reference images not displayed]

FINDINGS: Uterus

Measurements: 9.2 x 4.8 x 5.4 cm. Retroverted.. No fibroids or other
mass visualized.

Endometrium

Thickness: 10 mm.  No focal abnormality visualized.

Right ovary

Measurements: 4.2 x 1.9 x 1.4 cm. Normal appearance/no adnexal mass.

Left ovary

Measurements: 4.5 x 2.4 x 2.0 cm. Normal appearance/no adnexal mass.

Other findings

Trace fluid in the pelvis.
IMPRESSION: Endometrium measures 10 mm. If bleeding remains unresponsive to
hormonal or medical therapy, sonohysterogram should be considered
for focal lesion work-up. (Ref: Radiological Reasoning: Algorithmic
Workup of Abnormal Vaginal Bleeding with Endovaginal Sonography and
Sonohysterography. AJR [id number]; [id number])

Normal appearing ovaries bilaterally.

## 2017-11-15 ENCOUNTER — Other Ambulatory Visit: Payer: Self-pay | Admitting: Family Medicine

## 2017-11-15 DIAGNOSIS — J45909 Unspecified asthma, uncomplicated: Secondary | ICD-10-CM

## 2017-11-16 NOTE — Telephone Encounter (Signed)
Patient was advised.patient stated she will call back to schedule an appt.

## 2017-11-16 NOTE — Telephone Encounter (Signed)
This patient has not been seen in nearly 2 years for follow up of her asthma. Have sent in prescription for one box or nebules. But she needs to schedule follow up o.v.. I think she usually sees [place]enni.

## 2018-01-01 ENCOUNTER — Encounter: Payer: Self-pay | Admitting: Physician Assistant

## 2018-01-01 ENCOUNTER — Ambulatory Visit: Payer: Self-pay | Admitting: Physician Assistant

## 2018-01-01 VITALS — BP 120/80 | HR 100 | Temp 98.5°F | Resp 16 | Wt 178.4 lb

## 2018-01-01 DIAGNOSIS — R300 Dysuria: Secondary | ICD-10-CM

## 2018-01-01 DIAGNOSIS — N3000 Acute cystitis without hematuria: Secondary | ICD-10-CM

## 2018-01-01 LAB — POCT URINALYSIS DIPSTICK
Appearance: NORMAL
Bilirubin, UA: NEGATIVE
Glucose, UA: NEGATIVE
Ketones, UA: NEGATIVE
Nitrite, UA: NEGATIVE
PH UA: 6 (ref 5.0–8.0)
PROTEIN UA: NEGATIVE
RBC UA: NEGATIVE
Spec Grav, UA: 1.01 (ref 1.010–1.025)
UROBILINOGEN UA: 0.2 U/dL

## 2018-01-01 MED ORDER — SULFAMETHOXAZOLE-TRIMETHOPRIM 800-160 MG PO TABS: 1.0000 | ORAL_TABLET | Freq: Two times a day (BID) | ORAL | 0 refills | Status: DC

## 2018-01-01 NOTE — Patient Instructions (Signed)

## 2018-01-01 NOTE — Progress Notes (Signed)
Patient: Erin Hudson Female    DOB: [birth year]   32 y.o.   MRN: [id number] Visit Date: 01/01/2018  Today's Provider: Margaretann Loveless, PA-C   Chief Complaint  Patient presents with  . Urinary Tract Infection   Subjective:    Urinary Tract Infection   This is a new problem. The current episode started in the past 7 days (Reports it started last week and took AZO on Mon and Tuesday and it helped but sxs started back yesterday). The problem occurs every urination. The problem has been gradually worsening. The quality of the pain is described as burning and aching. The pain is mild. There has been no fever. She is sexually active. There is no history of pyelonephritis. Associated symptoms include frequency and urgency. Pertinent negatives include no chills, discharge, flank pain, hematuria, nausea, possible pregnancy or vomiting. She has tried increased fluids for the symptoms. The treatment provided no relief.      No Known Allergies   Current Outpatient Medications:  .  albuterol (PROVENTIL) (2.5 MG/3ML) 0.083% nebulizer solution, USE 1 VIAL BY NEBULIZATION EVERY 6 HOURS AS NEEDED FOR WHEEZING OR SHORTNES OF BREATH, Disp: 75 mL, Rfl: 0 .  Norethindrone Acetate-Ethinyl Estrad-FE (LOESTRIN 24 FE) 1-20 MG-MCG(24) tablet, Take 1 tablet by mouth daily., Disp: 1 Package, Rfl: 11 .  HYDROcodone-homatropine (HYCODAN) 5-1.5 MG/5ML syrup, Take 5 mLs by mouth every 8 (eight) hours as needed for cough. (Patient not taking: Reported on 01/01/2018), Disp: 120 mL, Rfl: 0  Review of Systems  Constitutional: Negative for chills.  Gastrointestinal: Negative for abdominal pain, nausea and vomiting.  Genitourinary: Positive for dysuria, frequency and urgency. Negative for flank pain, hematuria, vaginal discharge and vaginal pain.  Musculoskeletal: Negative for back pain.    Social History   Tobacco Use  . Smoking status: Never Smoker  . Smokeless tobacco: Never Used  Substance Use Topics  .  Alcohol use: Yes    Comment: OCC   Objective:   BP 120/80 (BP Location: Left Arm, Patient Position: Sitting, Cuff Size: Normal)   Pulse 100   Temp 98.5 F (36.9 C) (Oral)   Resp 16   Wt 178 lb 6.4 oz (80.9 kg)   LMP 12/04/2017   SpO2 98%   BMI 25.60 kg/m  Vitals:   01/01/18 1617  BP: 120/80  Pulse: 100  Resp: 16  Temp: 98.5 F (36.9 C)  TempSrc: Oral  SpO2: 98%  Weight: 178 lb 6.4 oz (80.9 kg)     Physical Exam  Constitutional: She is oriented to person, place, and time. She appears well-developed and well-nourished. No distress.  Cardiovascular: Normal rate, regular rhythm and normal heart sounds. Exam reveals no gallop and no friction rub.  No murmur heard. Pulmonary/Chest: Effort normal and breath sounds normal. No respiratory distress. She has no wheezes. She has no rales.  Abdominal: Soft. Normal appearance and bowel sounds are normal. She exhibits no distension and no mass. There is no hepatosplenomegaly. There is tenderness in the suprapubic area. There is no rebound, no guarding and no CVA tenderness.  Neurological: She is alert and oriented to person, place, and time.  Skin: Skin is warm and dry. She is not diaphoretic.  Vitals reviewed.       Assessment & Plan:     1. Acute cystitis without hematuria Worsening symptoms. UA positive. Will treat empirically with Bactrim as below. Continue to push fluids. She is to call if symptoms do not improve or  if they worsen.  - sulfamethoxazole-trimethoprim (BACTRIM DS,SEPTRA DS) 800-160 MG tablet; Take 1 tablet by mouth 2 (two) times daily.  Dispense: 10 tablet; Refill: 0  2. Dysuria See above medical treatment plan. - POCT urinalysis dipstick - sulfamethoxazole-trimethoprim (BACTRIM DS,SEPTRA DS) 800-160 MG tablet; Take 1 tablet by mouth 2 (two) times daily.  Dispense: 10 tablet; Refill: 0       Margaretann Loveless, PA-C  [facility]

## 2018-01-09 ENCOUNTER — Other Ambulatory Visit: Payer: Self-pay | Admitting: Family Medicine

## 2018-01-09 DIAGNOSIS — J45909 Unspecified asthma, uncomplicated: Secondary | ICD-10-CM

## 2019-02-15 ENCOUNTER — Other Ambulatory Visit: Payer: Self-pay | Admitting: Family Medicine

## 2019-02-15 DIAGNOSIS — J45909 Unspecified asthma, uncomplicated: Secondary | ICD-10-CM

## 2019-02-15 NOTE — Telephone Encounter (Signed)
Requested medication (s) are due for refill today: yes  Requested medication (s) are on the active medication list: yes  Last refill: 06/28/2018  Future visit scheduled: no   Notes to clinic: overdue for office  Review for refill  Requested Prescriptions  Pending Prescriptions Disp Refills   albuterol (PROVENTIL) (2.5 MG/3ML) 0.083% nebulizer solution [Pharmacy Med Name: ALBUTEROL SUL 2.5 MG/3 ML SOLN] 75 mL 3    Sig: USE 1 VIAL BY NEBULIZATION EVERY 6 HOURS AS NEEDED FOR WHEEZING OR SHORTNES OF BREATH     Pulmonology:  Beta Agonists Failed - 02/15/2019 11:53 AM      Failed - One inhaler should last at least one month. If the patient is requesting refills earlier, contact the patient to check for uncontrolled symptoms.      Failed - Valid encounter within last 12 months    Recent Outpatient Visits          1 year ago Acute cystitis without hematuria   [facility] [place], [place], [place]   3 years ago Cough   [facility] Birdie Sons, MD   3 years ago [place] Jerrol Banana., MD   3 years ago [place], [place]

## 2020-03-24 ENCOUNTER — Other Ambulatory Visit: Payer: Self-pay | Admitting: Physician Assistant

## 2020-03-24 DIAGNOSIS — J45909 Unspecified asthma, uncomplicated: Secondary | ICD-10-CM

## 2020-03-25 ENCOUNTER — Telehealth: Payer: Self-pay | Admitting: Physician Assistant

## 2020-03-25 DIAGNOSIS — J45909 Unspecified asthma, uncomplicated: Secondary | ICD-10-CM

## 2020-03-26 NOTE — Telephone Encounter (Signed)
Pt has made an appt for next Wednesday for a med follow up and wanted to see if refill can be sent until her appt for Albuterol Sulfate (2.5 MG/3ML) 0.083% / please advise

## 2020-03-28 HISTORY — PX: INDUCED ABORTION: SHX677

## 2020-04-01 ENCOUNTER — Encounter: Payer: Self-pay | Admitting: Physician Assistant

## 2020-04-01 ENCOUNTER — Telehealth (INDEPENDENT_AMBULATORY_CARE_PROVIDER_SITE_OTHER): Payer: Self-pay | Admitting: Physician Assistant

## 2020-04-01 ENCOUNTER — Telehealth: Payer: Self-pay | Admitting: Physician Assistant

## 2020-04-01 VITALS — Ht 71.0 in | Wt 183.0 lb

## 2020-04-01 DIAGNOSIS — J45909 Unspecified asthma, uncomplicated: Secondary | ICD-10-CM

## 2020-04-01 MED ORDER — ALBUTEROL SULFATE HFA 108 (90 BASE) MCG/ACT IN AERS: 1.0000 | INHALATION_SPRAY | Freq: Four times a day (QID) | RESPIRATORY_TRACT | 3 refills | Status: DC | PRN

## 2020-04-01 MED ORDER — ALBUTEROL SULFATE (2.5 MG/3ML) 0.083% IN NEBU: INHALATION_SOLUTION | RESPIRATORY_TRACT | 3 refills | Status: DC

## 2020-04-01 NOTE — Patient Instructions (Signed)
Asthma, Adult  Asthma is a long-term (chronic) condition in which the airways get tight and narrow. The airways are the breathing passages that lead from the nose and mouth down into the lungs. A person with asthma will have times when symptoms get worse. These are called asthma attacks. They can cause coughing, whistling sounds when you breathe (wheezing), shortness of breath, and chest pain. They can make it hard to breathe. There is no cure for asthma, but medicines and lifestyle changes can help control it. There are many things that can bring on an asthma attack or make asthma symptoms worse (triggers). Common triggers include:  Mold.  Dust.  Cigarette smoke.  Cockroaches.  Things that can cause allergy symptoms (allergens). These include animal skin flakes (dander) and pollen from trees or grass.  Things that pollute the air. These may include household cleaners, wood smoke, smog, or chemical odors.  Cold air, weather changes, and wind.  Crying or laughing hard.  Stress.  Certain medicines or drugs.  Certain foods such as dried fruit, potato chips, and grape juice.  Infections, such as a cold or the flu.  Certain medical conditions or diseases.  Exercise or tiring activities. Asthma may be treated with medicines and by staying away from the things that cause asthma attacks. Types of medicines may include:  Controller medicines. These help prevent asthma symptoms. They are usually taken every day.  Fast-acting reliever or rescue medicines. These quickly relieve asthma symptoms. They are used as needed and provide short-term relief.  Allergy medicines if your attacks are brought on by allergens.  Medicines to help control the body's defense (immune) system. Follow these instructions at home: Avoiding triggers in your home  Change your heating and air conditioning filter often.  Limit your use of fireplaces and wood stoves.  Get rid of pests (such as roaches and  mice) and their droppings.  Throw away plants if you see mold on them.  Clean your floors. Dust regularly. Use cleaning products that do not smell.  Have someone vacuum when you are not home. Use a vacuum cleaner with a HEPA filter if possible.  Replace carpet with wood, tile, or vinyl flooring. Carpet can trap animal skin flakes and dust.  Use allergy-proof pillows, mattress covers, and box spring covers.  Wash bed sheets and blankets every week in hot water. Dry them in a dryer.  Keep your bedroom free of any triggers.  Avoid pets and keep windows closed when things that cause allergy symptoms are in the air.  Use blankets that are made of polyester or cotton.  Clean bathrooms and kitchens with bleach. If possible, have someone repaint the walls in these rooms with mold-resistant paint. Keep out of the rooms that are being cleaned and painted.  Wash your hands often with soap and water. If soap and water are not available, use hand sanitizer.  Do not allow anyone to smoke in your home. General instructions  Take over-the-counter and prescription medicines only as told by your doctor. ? Talk with your doctor if you have questions about how or when to take your medicines. ? Make note if you need to use your medicines more often than usual.  Do not use any products that contain nicotine or tobacco, such as cigarettes and e-cigarettes. If you need help quitting, ask your doctor.  Stay away from secondhand smoke.  Avoid doing things outdoors when allergen counts are high and when air quality is low.  Wear a ski mask   when doing outdoor activities in the winter. The mask should cover your nose and mouth. Exercise indoors on cold days if you can.  Warm up before you exercise. Take time to cool down after exercise.  Use a peak flow meter as told by your doctor. A peak flow meter is a tool that measures how well the lungs are working.  Keep track of the peak flow meter's readings.  Write them down.  Follow your asthma action plan. This is a written plan for taking care of your asthma and treating your attacks.  Make sure you get all the shots (vaccines) that your doctor recommends. Ask your doctor about a flu shot and a pneumonia shot.  Keep all follow-up visits as told by your doctor. This is important. Contact a doctor if:  You have wheezing, shortness of breath, or a cough even while taking medicine to prevent attacks.  The mucus you cough up (sputum) is thicker than usual.  The mucus you cough up changes from clear or white to yellow, green, gray, or bloody.  You have problems from the medicine you are taking, such as: ? A rash. ? Itching. ? Swelling. ? Trouble breathing.  You need reliever medicines more than 2-3 times a week.  Your peak flow reading is still at 50-79% of your personal best after following the action plan for 1 hour.  You have a fever. Get help right away if:  You seem to be worse and are not responding to medicine during an asthma attack.  You are short of breath even at rest.  You get short of breath when doing very little activity.  You have trouble eating, drinking, or talking.  You have chest pain or tightness.  You have a fast heartbeat.  Your lips or fingernails start to turn blue.  You are light-headed or dizzy, or you faint.  Your peak flow is less than 50% of your personal best.  You feel too tired to breathe normally. Summary  Asthma is a long-term (chronic) condition in which the airways get tight and narrow. An asthma attack can make it hard to breathe.  Asthma cannot be cured, but medicines and lifestyle changes can help control it.  Make sure you understand how to avoid triggers and how and when to use your medicines. This information is not intended to replace advice given to you by your health care provider. Make sure you discuss any questions you have with your health care provider. Document Revised:  05/17/2018 Document Reviewed: 04/18/2016 Elsevier Patient Education  2020 [employer]  

## 2020-04-01 NOTE — Telephone Encounter (Signed)
Does not have MyChart wants to do a Virtual Visit.  Thanks, [employer]

## 2020-04-01 NOTE — Progress Notes (Signed)
Virtual Visit via Video Note  I connected with [name] on 04/01/20 at  3:40 PM EST by a video enabled telemedicine application and verified that I am speaking with the correct person using two identifiers.  Location: Patient: Erin Hudson Provider: Home office in [place]   I discussed the limitations of evaluation and management by telemedicine and the availability of in person appointments. The patient expressed understanding and agreed to proceed.  Established patient visit   Patient: Erin Hudson   DOB: June 06, 1985   34 y.o. Female  MRN: [id number] Visit Date: 04/01/2020  Today's healthcare provider: Margaretann Loveless, PA-C   Chief Complaint  Patient presents with  . Asthma   Subjective    Asthma This is a chronic problem. Pertinent negatives include no appetite change, chest pain, dyspnea on exertion, ear congestion, ear pain, fever, headaches, heartburn, malaise/fatigue, nasal congestion, postnasal drip, rhinorrhea, trouble swallowing or weight loss. She reports complete improvement on treatment. Her past medical history is significant for asthma.   Pt is needing refills on her albuterol for her nebulizer, she is also requesting inhaler to use as needed.    Patient Active Problem List   Diagnosis Date Noted  . Allergic rhinitis 06/02/2015  . AB (asthmatic bronchitis) 06/02/2015  . Contraception management 06/02/2015  . Dysmenorrhea 06/02/2015  . Irregular menstrual cycle 06/02/2015  . Vitamin D deficiency 11/27/2008  . Acne 07/24/2008  . Pityriasis rosea 07/24/2008  . CD (contact dermatitis) 07/16/2008   Past Medical History:  Diagnosis Date  . Asthma    Social History   Tobacco Use  . Smoking status: Never Smoker  . Smokeless tobacco: Never Used  Substance Use Topics  . Alcohol use: Yes    Comment: OCC  . Drug use: No   No Known Allergies   Medications: Outpatient Medications Prior to Visit  Medication Sig  . albuterol  (PROVENTIL) (2.5 MG/3ML) 0.083% nebulizer solution USE 1 VIAL BY NEBULIZATION EVERY 6 HOURS AS NEEDED FOR WHEEZING OR SHORTNES OF BREATH  . Norethindrone Acetate-Ethinyl Estrad-FE (LOESTRIN 24 FE) 1-20 MG-MCG(24) tablet Take 1 tablet by mouth daily.  Erin Hudson HYDROcodone-homatropine (HYCODAN) 5-1.5 MG/5ML syrup Take 5 mLs by mouth every 8 (eight) hours as needed for cough.  . sulfamethoxazole-trimethoprim (BACTRIM DS,SEPTRA DS) 800-160 MG tablet Take 1 tablet by mouth 2 (two) times daily.   No facility-administered medications prior to visit.    Review of Systems  Constitutional: Negative.  Negative for appetite change, fever, malaise/fatigue and weight loss.  HENT: Negative.  Negative for ear pain, postnasal drip, rhinorrhea and trouble swallowing.   Eyes: Negative.   Respiratory: Negative.   Cardiovascular: Negative.  Negative for chest pain and dyspnea on exertion.  Gastrointestinal: Negative.  Negative for heartburn.  Neurological: Negative for dizziness, light-headedness and headaches.      Objective    Ht 5\' 11"  (1.803 m)   Wt 183 lb (83 kg)   LMP 03/02/2020   BMI 25.52 kg/m    Physical Exam Vitals reviewed.  Constitutional:      Appearance: Normal appearance. She is well-developed and well-nourished.  HENT:     Head: Normocephalic and atraumatic.  Eyes:     Extraocular Movements: EOM normal.  Pulmonary:     Effort: Pulmonary effort is normal. No respiratory distress.  Musculoskeletal:     Cervical back: Normal range of motion and neck supple.  Neurological:     Mental Status: She is alert.  Psychiatric:  Mood and Affect: Mood and affect and mood normal.        Behavior: Behavior normal.        Thought Content: Thought content normal.        Judgment: Judgment normal.      No results found for any visits on 04/01/20.  Assessment & Plan     1. Asthmatic bronchitis without complication, unspecified asthma severity, unspecified whether persistent Refilled  albuterol nebulizer and added albuterol inhaler so she can carry this to work with her. Overall does not need or use daily. Most recent flare was during weather change. Advised to call if symptoms worsen.  - albuterol (PROVENTIL) (2.5 MG/3ML) 0.083% nebulizer solution; USE 1 VIAL BY NEBULIZATION EVERY 6 HOURS AS NEEDED FOR WHEEZING OR SHORTNES OF BREATH  Dispense: 75 mL; Refill: 3 - albuterol (VENTOLIN HFA) 108 (90 Base) MCG/ACT inhaler; Inhale 1-2 puffs into the lungs every 6 (six) hours as needed for wheezing or shortness of breath.  Dispense: 18 g; Refill: 3   I discussed the assessment and treatment plan with the patient. The patient was provided an opportunity to ask questions and all were answered. The patient agreed with the plan and demonstrated an understanding of the instructions.   The patient was advised to call back or seek an in-person evaluation if the symptoms worsen or if the condition fails to improve as anticipated.  I provided 5 minutes of face-to-face time during this encounter via MyChart Video enabled encounter.   No follow-ups on file.      Delmer Islam, PA-C, have reviewed all documentation for this visit. The documentation on 04/01/20 for the exam, diagnosis, procedures, and orders are all accurate and complete.   Reine Just  [facility] [phone] (phone) [phone] (fax)  [facility]

## 2020-09-21 ENCOUNTER — Other Ambulatory Visit: Payer: Self-pay

## 2020-09-21 ENCOUNTER — Ambulatory Visit (HOSPITAL_COMMUNITY)
Admission: EM | Admit: 2020-09-21 | Discharge: 2020-09-21 | Disposition: A | Discharge status: Home / Self Care | Payer: No Typology Code available for payment source | Attending: Family Medicine | Admitting: Family Medicine

## 2020-09-21 ENCOUNTER — Encounter (HOSPITAL_COMMUNITY): Payer: Self-pay

## 2020-09-21 ENCOUNTER — Ambulatory Visit: Payer: Self-pay | Admitting: *Deleted

## 2020-09-21 DIAGNOSIS — N898 Other specified noninflammatory disorders of vagina: Secondary | ICD-10-CM | POA: Diagnosis not present

## 2020-09-21 DIAGNOSIS — R300 Dysuria: Secondary | ICD-10-CM

## 2020-09-21 LAB — POCT URINALYSIS DIPSTICK, ED / UC
Bilirubin Urine: NEGATIVE
Glucose, UA: NEGATIVE mg/dL
Nitrite: NEGATIVE
Protein, ur: 100 mg/dL — AB
Specific Gravity, Urine: >=1.03 (ref 1.005–1.030)
Urobilinogen, UA: 0.2 mg/dL (ref 0.0–1.0)
pH: 6 (ref 5.0–8.0)

## 2020-09-21 LAB — POC URINE PREG, ED: Preg Test, Ur: NEGATIVE

## 2020-09-21 MED ORDER — METRONIDAZOLE 500 MG PO TABS: 500.0000 mg | ORAL_TABLET | Freq: Two times a day (BID) | ORAL | 0 refills | Status: DC

## 2020-09-21 NOTE — ED Provider Notes (Signed)
[facility]    CSN: [id number] Arrival date & time: 09/21/20  1642      History   Chief Complaint Chief Complaint  Patient presents with   Vaginal Pain    HPI Erin Hudson is a 35 y.o. female.   Patient presenting today with 2-week history of vaginal irritation and pain, burning with urination.  She denies pelvic pain, fever, chills, discharge, vaginal odor, hematuria, nausea vomiting diarrhea, concern for STIs.  Currently on menstrual cycle for the past 4 days.  Trying Azo over-the-counter with no relief of symptoms.  States she has had this sensation once before and was told it was bacterial vaginosis, cleared with oral antibiotics.   Past Medical History:  Diagnosis Date   Asthma     Patient Active Problem List   Diagnosis Date Noted   Allergic rhinitis 06/02/2015   AB (asthmatic bronchitis) 06/02/2015   Contraception management 06/02/2015   Dysmenorrhea 06/02/2015   Irregular menstrual cycle 06/02/2015   Vitamin D deficiency 11/27/2008   Acne 07/24/2008   Pityriasis rosea 07/24/2008   CD (contact dermatitis) 07/16/2008    Past Surgical History:  Procedure Laterality Date   HERNIA REPAIR     pyloric stenosis     TONSILLECTOMY      OB History   No obstetric history on file.      Home Medications    Prior to Admission medications   Medication Sig Start Date End Date Taking? Authorizing Provider  metroNIDAZOLE (FLAGYL) 500 MG tablet Take 1 tablet (500 mg total) by mouth 2 (two) times daily. 09/21/20  Yes Particia Nearing, PA-C  albuterol (PROVENTIL) (2.5 MG/3ML) 0.083% nebulizer solution USE 1 VIAL BY NEBULIZATION EVERY 6 HOURS AS NEEDED FOR WHEEZING OR SHORTNES OF BREATH 04/01/20   Margaretann Loveless, PA-C  albuterol (VENTOLIN HFA) 108 (90 Base) MCG/ACT inhaler Inhale 1-2 puffs into the lungs every 6 (six) hours as needed for wheezing or shortness of breath. 04/01/20   Margaretann Loveless, PA-C    Family History Family History  Problem  Relation Age of Onset   Heart disease Mother    Hypertension Mother    Heart disease Father    Depression Father    Colon polyps Father    Healthy Sister     Social History Social History   Tobacco Use   Smoking status: Never   Smokeless tobacco: Never  Substance Use Topics   Alcohol use: Yes    Comment: OCC   Drug use: No     Allergies   Patient has no known allergies.   Review of Systems Review of Systems Per HPI  Physical Exam Triage Vital Signs ED Triage Vitals  Enc Vitals Group     BP 09/21/20 1713 125/68     Pulse Rate 09/21/20 1713 74     Resp 09/21/20 1713 18     Temp 09/21/20 1713 98.6 F (37 C)     Temp Source 09/21/20 1713 Oral     SpO2 09/21/20 1713 100 %     Weight --      Height --      Head Circumference --      Peak Flow --      Pain Score 09/21/20 1712 6     Pain Loc --      Pain Edu? --      Excl. in GC? --    No data found.  Updated Vital Signs BP 125/68 (BP Location: Right Arm)  Pulse 74   Temp 98.6 F (37 C) (Oral)   Resp 18   LMP 09/17/2020 (Exact Date)   SpO2 100%   Visual Acuity Right Eye Distance:   Left Eye Distance:   Bilateral Distance:    Right Eye Near:   Left Eye Near:    Bilateral Near:     Physical Exam Vitals and nursing note reviewed.  Constitutional:      Appearance: Normal appearance. She is not ill-appearing.  HENT:     Head: Atraumatic.     Mouth/Throat:     Mouth: Mucous membranes are moist.  Eyes:     Extraocular Movements: Extraocular movements intact.     Conjunctiva/sclera: Conjunctivae normal.  Cardiovascular:     Rate and Rhythm: Normal rate and regular rhythm.     Heart sounds: Normal heart sounds.  Pulmonary:     Effort: Pulmonary effort is normal.     Breath sounds: Normal breath sounds.  Abdominal:     General: Bowel sounds are normal. There is no distension.     Palpations: Abdomen is soft.     Tenderness: There is no abdominal tenderness. There is no right CVA tenderness,  left CVA tenderness or guarding.  Genitourinary:    Comments: GU exam deferred, self swab performed Musculoskeletal:        General: Normal range of motion.     Cervical back: Normal range of motion and neck supple.  Skin:    General: Skin is warm and dry.  Neurological:     Mental Status: She is alert and oriented to person, place, and time.  Psychiatric:        Mood and Affect: Mood normal.        Thought Content: Thought content normal.        Judgment: Judgment normal.   UC Treatments / Results  Labs (all labs ordered are listed, but only abnormal results are displayed) Labs Reviewed  POCT URINALYSIS DIPSTICK, ED / UC - Abnormal; Notable for the following components:      Result Value   Ketones, ur TRACE (*)    Hgb urine dipstick LARGE (*)    Protein, ur 100 (*)    Leukocytes,Ua TRACE (*)    All other components within normal limits  URINE CULTURE  POC URINE PREG, ED  CERVICOVAGINAL ANCILLARY ONLY    EKG   Radiology No results found.  Procedures Procedures (including critical care time)  Medications Ordered in UC Medications - No data to display  Initial Impression / Assessment and Plan / UC Course  I have reviewed the triage vital signs and the nursing notes.  Pertinent labs & imaging results that were available during my care of the patient were reviewed by me and considered in my medical decision making (see chart for details).     Exam and vitals completely benign, UA with hemoglobin and trace leukocytes, likely related to being on her menstrual cycle.  Urine culture and vaginal swab pending.  Given her consistent symptoms with past BV infections we will start metronidazole while awaiting results.  Discussed good vaginal hygiene, safe sexual practices.  Follow-up if symptoms worsening in the meantime.  Final Clinical Impressions(s) / UC Diagnoses   Final diagnoses:  Vaginal irritation  Dysuria   Discharge Instructions   None    ED Prescriptions      Medication Sig Dispense Auth. Provider   metroNIDAZOLE (FLAGYL) 500 MG tablet Take 1 tablet (500 mg total) by mouth 2 (two) times  daily. 14 tablet Particia Nearing, [place]      PDMP not reviewed this encounter.   Particia Nearing, [place] 09/21/20 1753

## 2020-09-21 NOTE — Telephone Encounter (Signed)
Patient is calling to report she is waiting for call about appointment from the office- due to the severity of her symptoms- advised patient - UC for evaluation and treatment.

## 2020-09-21 NOTE — ED Triage Notes (Signed)
Pt in with c/o vaginal pain x 2 weeks. Pt also c/o dysuria  Pt has taken azo with no relief

## 2020-09-21 NOTE — Telephone Encounter (Signed)
Noted  

## 2020-09-21 NOTE — Telephone Encounter (Signed)
Reason for Disposition  Urinating more frequently than usual (i.e., frequency)  Answer Assessment - Initial Assessment Questions 1. SYMPTOM: "What's the main symptom you're concerned about?" (e.g., frequency, incontinence)     Painful painful, frequency- urgency 2. ONSET: "When did the  pain/burning  start?"     2 weeks 3. PAIN: "Is there any pain?" If Yes, ask: "How bad is it?" (Scale: 1-10; mild, moderate, severe)     Yes- severe 4. CAUSE: "What do you think is causing the symptoms?"     UTI 5. OTHER SYMPTOMS: "Do you have any other symptoms?" (e.g., fever, flank pain, blood in urine, pain with urination)     Pain with urination 6. PREGNANCY: "Is there any chance you are pregnant?" "When was your last menstrual period?"     LMP- 6/23  Protocols used: Urinary Symptoms-A-AH

## 2020-09-22 LAB — CERVICOVAGINAL ANCILLARY ONLY
Bacterial Vaginitis (gardnerella): POSITIVE — AB
Candida Glabrata: NEGATIVE
Candida Vaginitis: NEGATIVE
Chlamydia: NEGATIVE
Comment: NEGATIVE
Comment: NEGATIVE
Comment: NEGATIVE
Comment: NEGATIVE
Comment: NEGATIVE
Comment: NORMAL
Neisseria Gonorrhea: NEGATIVE
Trichomonas: NEGATIVE

## 2020-09-24 LAB — URINE CULTURE: Culture: 60000 — AB

## 2020-11-06 ENCOUNTER — Ambulatory Visit (INDEPENDENT_AMBULATORY_CARE_PROVIDER_SITE_OTHER): Payer: Self-pay | Admitting: Nurse Practitioner

## 2020-11-06 ENCOUNTER — Encounter: Payer: Self-pay | Admitting: Nurse Practitioner

## 2020-11-06 ENCOUNTER — Other Ambulatory Visit: Payer: Self-pay

## 2020-11-06 VITALS — BP 139/82 | HR 88 | Temp 99.0°F | Resp 18

## 2020-11-06 DIAGNOSIS — S39012A Strain of muscle, fascia and tendon of lower back, initial encounter: Secondary | ICD-10-CM

## 2020-11-06 MED ORDER — MELOXICAM 15 MG PO TABS: 15.0000 mg | ORAL_TABLET | Freq: Every day | ORAL | 0 refills | Status: DC

## 2020-11-06 MED ORDER — CYCLOBENZAPRINE HCL 10 MG PO TABS: 10.0000 mg | ORAL_TABLET | Freq: Three times a day (TID) | ORAL | 0 refills | Status: DC | PRN

## 2020-11-06 MED ORDER — KETOROLAC TROMETHAMINE 60 MG/2ML IM SOLN
60.0000 mg | Freq: Once | INTRAMUSCULAR | Status: AC
  Administered 2020-11-06: 60 mg via INTRAMUSCULAR

## 2020-11-06 NOTE — Progress Notes (Signed)
Acute Office Visit  Subjective:    Patient ID: Erin Hudson, female    DOB: 22-Sep-1985, 34 y.o.   MRN: [id number]  Chief Complaint  Patient presents with   Back Pain    Started on Wednesday after taking the garbage out at work. Went to the gym after and is unsure if that made it worse. Pain is 6/10 today.     HPI Patient is in today for back pain that started on Wednesday after taking the garbage out at work. She went to the gym after and thinks that may have made it worse.   BACK PAIN  Duration: days Mechanism of injury:  taking the garbage out at work Location: Right and low back Onset: sudden Severity: 6/10 Quality: varies, sharp, aching, sore Frequency: constant Radiation: none Aggravating factors: lifting, movement, walking, laying, and bending Alleviating factors: rest, OTC pain patch Status: fluctuating Treatments attempted:  OTC pain patch and rest  Relief with NSAIDs?: mild Nighttime pain:  yes Paresthesias / decreased sensation:  no Bowel / bladder incontinence:  no Fevers:  no Dysuria / urinary frequency:  no   Past Medical History:  Diagnosis Date   Asthma     Past Surgical History:  Procedure Laterality Date   HERNIA REPAIR     pyloric stenosis     TONSILLECTOMY      Family History  Problem Relation Age of Onset   Heart disease Mother    Hypertension Mother    Heart disease Father    Depression Father    Colon polyps Father    Healthy Sister     Social History   Socioeconomic History   Marital status: Single    Spouse name: Not on file   Number of children: Not on file   Years of education: Not on file   Highest education level: Not on file  Occupational History   Not on file  Tobacco Use   Smoking status: Never   Smokeless tobacco: Never  Substance and Sexual Activity   Alcohol use: Yes    Comment: OCC   Drug use: No   Sexual activity: Yes    Birth control/protection: Pill  Other Topics Concern   Not on file  Social  History Narrative   Not on file   Social Determinants of Health   Financial Resource Strain: Not on file  Food Insecurity: Not on file  Transportation Needs: Not on file  Physical Activity: Not on file  Stress: Not on file  Social Connections: Not on file  Intimate Partner Violence: Not on file    Outpatient Medications Prior to Visit  Medication Sig Dispense Refill   albuterol (PROVENTIL) (2.5 MG/3ML) 0.083% nebulizer solution USE 1 VIAL BY NEBULIZATION EVERY 6 HOURS AS NEEDED FOR WHEEZING OR SHORTNES OF BREATH 75 mL 3   albuterol (VENTOLIN HFA) 108 (90 Base) MCG/ACT inhaler Inhale 1-2 puffs into the lungs every 6 (six) hours as needed for wheezing or shortness of breath. 18 g 3   metroNIDAZOLE (FLAGYL) 500 MG tablet Take 1 tablet (500 mg total) by mouth 2 (two) times daily. 14 tablet 0   No facility-administered medications prior to visit.    No Known Allergies  Review of Systems  Constitutional: Negative.   HENT: Negative.    Eyes: Negative.   Respiratory: Negative.    Cardiovascular: Negative.   Gastrointestinal: Negative.   Genitourinary: Negative.   Musculoskeletal:  Positive for back pain.  Skin: Negative.   Neurological: Negative.  Objective:    Physical Exam Vitals and nursing note reviewed.  Constitutional:      General: She is not in acute distress.    Appearance: Normal appearance.  HENT:     Head: Normocephalic.  Eyes:     Conjunctiva/sclera: Conjunctivae normal.  Musculoskeletal:        General: No swelling or tenderness.     Cervical back: Normal range of motion.     Comments: ROM to lumbar spine limited by pain  Skin:    General: Skin is warm.  Neurological:     General: No focal deficit present.     Mental Status: She is alert and oriented to person, place, and time.  Psychiatric:        Mood and Affect: Mood normal.        Behavior: Behavior normal.        Thought Content: Thought content normal.        Judgment: Judgment normal.     BP 139/82 (BP Location: Right Arm, Patient Position: Sitting)   Pulse 88   Temp 99 F (37.2 C) (Oral)   Resp 18   SpO2 97%  Wt Readings from Last 3 Encounters:  04/01/20 183 lb (83 kg)  01/01/18 178 lb 6.4 oz (80.9 kg)  12/28/15 199 lb (90.3 kg)    Health Maintenance Due  Topic Date Due   COVID-19 Vaccine (1) Never done   HIV Screening  Never done   Hepatitis C Screening  Never done   PAP SMEAR-Modifier  Never done   TETANUS/TDAP  11/21/2018   INFLUENZA VACCINE  10/26/2020    There are no preventive care reminders to display for this patient.   No results found for: TSH No results found for: WBC, HGB, HCT, MCV, PLT Lab Results  Component Value Date   NA 139 09/23/2011   K 4.1 09/23/2011   BUN 12 09/23/2011   CREATININE 0.8 09/23/2011   ALKPHOS 61 09/23/2011   AST 19 09/23/2011   ALT 16 09/23/2011   Lab Results  Component Value Date   CHOL 141 09/23/2011   Lab Results  Component Value Date   HDL 71 (A) 09/23/2011   Lab Results  Component Value Date   LDLCALC 59 09/23/2011   Lab Results  Component Value Date   TRIG 57 09/23/2011   No results found for: CHOLHDL No results found for: XFGH8E     Assessment & Plan:   Problem List Items Addressed This Visit   None Visit Diagnoses     Back strain, initial encounter    -  Primary   Treat with flexeril and meloxicam prn and toradol IM x1. Exercises given. Can continue pain patch, heat/ice, and rest. Do not go to gym until pain is better.    Relevant Medications   ketorolac (TORADOL) injection 60 mg (Completed)        Meds ordered this encounter  Medications   meloxicam (MOBIC) 15 MG tablet    Sig: Take 1 tablet (15 mg total) by mouth daily.    Dispense:  30 tablet    Refill:  0   cyclobenzaprine (FLEXERIL) 10 MG tablet    Sig: Take 1 tablet (10 mg total) by mouth 3 (three) times daily as needed for muscle spasms.    Dispense:  30 tablet    Refill:  0   ketorolac (TORADOL) injection 60 mg      Gerre Scull, NP

## 2021-02-22 ENCOUNTER — Ambulatory Visit (HOSPITAL_COMMUNITY)
Admission: EM | Admit: 2021-02-22 | Discharge: 2021-02-22 | Disposition: A | Discharge status: Home / Self Care | Payer: Self-pay | Attending: Student | Admitting: Student

## 2021-02-22 ENCOUNTER — Other Ambulatory Visit: Payer: Self-pay

## 2021-02-22 ENCOUNTER — Encounter (HOSPITAL_COMMUNITY): Payer: Self-pay

## 2021-02-22 DIAGNOSIS — J069 Acute upper respiratory infection, unspecified: Secondary | ICD-10-CM

## 2021-02-22 MED ORDER — PROMETHAZINE-DM 6.25-15 MG/5ML PO SYRP: 5.0000 mL | ORAL_SOLUTION | Freq: Four times a day (QID) | ORAL | 0 refills | Status: DC | PRN

## 2021-02-22 NOTE — Discharge Instructions (Addendum)
-  Promethazine DM cough syrup for congestion/cough. This could make you drowsy, so take at night before bed. -Mucinex during the day -You can take Tylenol up to 1000 mg 3 times daily, and ibuprofen up to 600 mg 3 times daily with food.  You can take these together, or alternate every 3-4 hours. -With a virus, you're typically contagious for 5-7 days, or as long as you're having fevers.  -Drink plenty of water!

## 2021-02-22 NOTE — ED Provider Notes (Signed)
[place]    CSN: [id number] Arrival date & time: 02/22/21  0857      History   Chief Complaint Chief Complaint  Patient presents with   URI    HPI Erin Hudson is a 35 y.o. female presenting with viral syndrome for 3 days.  Medical history asthmatic bronchitis, does not require an inhaler for this.  Describes 3 days of subjective chills and generalized body aches.  Took a Tylenol for the symptoms with minimal improvement.  Nonproductive cough.  Denies nausea, vomiting, diarrhea; she is tolerating fluids and food.  Denies known sick contacts.  HPI  Past Medical History:  Diagnosis Date   Asthma     Patient Active Problem List   Diagnosis Date Noted   Allergic rhinitis 06/02/2015   AB (asthmatic bronchitis) 06/02/2015   Contraception management 06/02/2015   Dysmenorrhea 06/02/2015   Irregular menstrual cycle 06/02/2015   Vitamin D deficiency 11/27/2008   Acne 07/24/2008   Pityriasis rosea 07/24/2008   CD (contact dermatitis) 07/16/2008    Past Surgical History:  Procedure Laterality Date   HERNIA REPAIR     pyloric stenosis     TONSILLECTOMY      OB History   No obstetric history on file.      Home Medications    Prior to Admission medications   Medication Sig Start Date End Date Taking? Authorizing Provider  promethazine-dextromethorphan (PROMETHAZINE-DM) 6.25-15 MG/5ML syrup Take 5 mLs by mouth 4 (four) times daily as needed for cough. 02/22/21  Yes Hazel Sams, PA-C  albuterol (PROVENTIL) (2.5 MG/3ML) 0.083% nebulizer solution USE 1 VIAL BY NEBULIZATION EVERY 6 HOURS AS NEEDED FOR WHEEZING OR SHORTNES OF BREATH 04/01/20   Mar Daring, PA-C  albuterol (VENTOLIN HFA) 108 (90 Base) MCG/ACT inhaler Inhale 1-2 puffs into the lungs every 6 (six) hours as needed for wheezing or shortness of breath. 04/01/20   Mar Daring, PA-C  cyclobenzaprine (FLEXERIL) 10 MG tablet Take 1 tablet (10 mg total) by mouth 3 (three) times daily as  needed for muscle spasms. 11/06/20   McElwee, Lauren A, NP  meloxicam (MOBIC) 15 MG tablet Take 1 tablet (15 mg total) by mouth daily. 11/06/20   McElwee, Scheryl Darter, NP    Family History Family History  Problem Relation Age of Onset   Heart disease Mother    Hypertension Mother    Heart disease Father    Depression Father    Colon polyps Father    Healthy Sister     Social History Social History   Tobacco Use   Smoking status: Never   Smokeless tobacco: Never  Substance Use Topics   Alcohol use: Yes    Comment: OCC   Drug use: No     Allergies   Patient has no known allergies.   Review of Systems Review of Systems  Constitutional:  Negative for appetite change, chills and fever.  HENT:  Positive for congestion. Negative for ear pain, rhinorrhea, sinus pressure, sinus pain and sore throat.   Eyes:  Negative for redness and visual disturbance.  Respiratory:  Positive for cough. Negative for chest tightness, shortness of breath and wheezing.   Cardiovascular:  Negative for chest pain and palpitations.  Gastrointestinal:  Negative for abdominal pain, constipation, diarrhea, nausea and vomiting.  Genitourinary:  Negative for dysuria, frequency and urgency.  Musculoskeletal:  Negative for myalgias.  Neurological:  Negative for dizziness, weakness and headaches.  Psychiatric/Behavioral:  Negative for confusion.   All other  systems reviewed and are negative.   Physical Exam Triage Vital Signs ED Triage Vitals  Enc Vitals Group     BP 02/22/21 1035 130/82     Pulse Rate 02/22/21 1035 90     Resp 02/22/21 1035 17     Temp 02/22/21 1035 99.5 F (37.5 C)     Temp Source 02/22/21 1035 Oral     SpO2 02/22/21 1035 98 %     Weight --      Height --      Head Circumference --      Peak Flow --      Pain Score 02/22/21 1037 6     Pain Loc --      Pain Edu? --      Excl. in [place]? --    No data found.  Updated Vital Signs BP 130/82 (BP Location: Right Arm)   Pulse 90    Temp 99.5 F (37.5 C) (Oral)   Resp 17   LMP 01/24/2021   SpO2 98%   Visual Acuity Right Eye Distance:   Left Eye Distance:   Bilateral Distance:    Right Eye Near:   Left Eye Near:    Bilateral Near:     Physical Exam Vitals reviewed.  Constitutional:      General: She is not in acute distress.    Appearance: Normal appearance. She is not ill-appearing.  HENT:     Head: Normocephalic and atraumatic.     Right Ear: Tympanic membrane, ear canal and external ear normal. No tenderness. No middle ear effusion. There is no impacted cerumen. Tympanic membrane is not perforated, erythematous, retracted or bulging.     Left Ear: Tympanic membrane, ear canal and external ear normal. No tenderness.  No middle ear effusion. There is no impacted cerumen. Tympanic membrane is not perforated, erythematous, retracted or bulging.     Nose: Nose normal. No congestion.     Mouth/Throat:     Mouth: Mucous membranes are moist.     Pharynx: Uvula midline. No oropharyngeal exudate or posterior oropharyngeal erythema.  Eyes:     Extraocular Movements: Extraocular movements intact.     Pupils: Pupils are equal, round, and reactive to light.  Cardiovascular:     Rate and Rhythm: Normal rate and regular rhythm.     Heart sounds: Normal heart sounds.  Pulmonary:     Effort: Pulmonary effort is normal.     Breath sounds: Normal breath sounds. No decreased breath sounds, wheezing, rhonchi or rales.  Abdominal:     Palpations: Abdomen is soft.     Tenderness: There is no abdominal tenderness. There is no guarding or rebound.  Lymphadenopathy:     Cervical: No cervical adenopathy.     Right cervical: No superficial cervical adenopathy.    Left cervical: No superficial cervical adenopathy.  Neurological:     General: No focal deficit present.     Mental Status: She is alert and oriented to person, place, and time.  Psychiatric:        Mood and Affect: Mood normal.        Behavior: Behavior normal.         Thought Content: Thought content normal.        Judgment: Judgment normal.     UC Treatments / Results  Labs (all labs ordered are listed, but only abnormal results are displayed) Labs Reviewed - No data to display  EKG   Radiology No results found.  Procedures Procedures (  including critical care time)  Medications Ordered in UC Medications - No data to display  Initial Impression / Assessment and Plan / UC Course  I have reviewed the triage vital signs and the nursing notes.  Pertinent labs & imaging results that were available during my care of the patient were reviewed by me and considered in my medical decision making (see chart for details).     This patient is a very pleasant 35 y.o. year old female presenting with viral URI with cough. Today this pt is afebrile nontachycardic nontachypneic, oxygenating well on room air, no wheezes rhonchi or rales.   Will defer influenza testing as she is out of the tamiflu window.  Promethazine DM sent.   Work note provided. ED return precautions discussed. Patient verbalizes understanding and agreement.      Final Clinical Impressions(s) / UC Diagnoses   Final diagnoses:  Viral upper respiratory tract infection     Discharge Instructions      -Promethazine DM cough syrup for congestion/cough. This could make you drowsy, so take at night before bed. -Mucinex during the day -You can take Tylenol up to 1000 mg 3 times daily, and ibuprofen up to 600 mg 3 times daily with food.  You can take these together, or alternate every 3-4 hours. -With a virus, you're typically contagious for 5-7 days, or as long as you're having fevers.  -Drink plenty of water!   ED Prescriptions     Medication Sig Dispense Auth. Provider   promethazine-dextromethorphan (PROMETHAZINE-DM) 6.25-15 MG/5ML syrup Take 5 mLs by mouth 4 (four) times daily as needed for cough. 118 mL Rhys Martini, PA-C      PDMP not reviewed this  encounter.   Rhys Martini, PA-C 02/22/21 1105

## 2021-02-22 NOTE — ED Triage Notes (Signed)
Pt presents with cough, congestion, and generalized body aches X 2 days.

## 2021-07-20 ENCOUNTER — Telehealth: Payer: Self-pay | Admitting: Family Medicine

## 2021-07-20 DIAGNOSIS — J9801 Acute bronchospasm: Secondary | ICD-10-CM

## 2021-07-20 DIAGNOSIS — J45909 Unspecified asthma, uncomplicated: Secondary | ICD-10-CM

## 2021-07-20 MED ORDER — ALBUTEROL SULFATE (2.5 MG/3ML) 0.083% IN NEBU: INHALATION_SOLUTION | RESPIRATORY_TRACT | 0 refills | Status: DC

## 2021-07-20 MED ORDER — PROMETHAZINE-DM 6.25-15 MG/5ML PO SYRP: 2.5000 mL | ORAL_SOLUTION | Freq: Two times a day (BID) | ORAL | 0 refills | Status: DC | PRN

## 2021-07-20 MED ORDER — ALBUTEROL SULFATE HFA 108 (90 BASE) MCG/ACT IN AERS: 1.0000 | INHALATION_SPRAY | Freq: Four times a day (QID) | RESPIRATORY_TRACT | 0 refills | Status: DC | PRN

## 2021-07-20 NOTE — Patient Instructions (Signed)
Dextromethorphan; Promethazine Solution ?What is this medication? ?DEXTROMETHORPHAN; PROMETHAZINE (dex troe meth OR fan; proe METH a zeen) treats the symptoms of the common cold, allergies, or flu. It works by reducing cough, a runny or stuffy nose, and red, itchy eyes. It is a combination of a cough suppressant and an antihistamine. ?This medicine may be used for other purposes; ask your health care provider or pharmacist if you have questions. ?COMMON BRAND NAME(S): Phen Tuss DM ?What should I tell my care team before I take this medication? ?They need to know if you have any of the following conditions: ?Blockage in your bowel ?Diabetes ?Eczema ?Glaucoma ?Heart disease ?Liver disease ?Low blood counts, like low white cell, platelet, or red cell counts ?Lung or breathing disease, like asthma ?Parkinson disease ?Prostate disease ?Seizures ?Stomach or intestine problems ?Trouble passing urine ?An unusual or allergic reaction to promethazine, dextromethorphan, other medications, foods, dyes, or preservatives ?Pregnant or trying to get pregnant ?Breast-feeding ?How should I use this medication? ?Take this medication by mouth with a glass of water. Follow the directions on the prescription label. Use a specially marked spoon or container to measure your medication. Household spoons are not accurate. Take your doses at regular intervals. Do not take your medication more often than directed. ?Talk to your care team about the use of this medication in children. Special care may be needed. Do not use this medication in children less than 60 years of age. ?Patients over 74 years old may have a stronger reaction and need a smaller dose. ?Overdosage: If you think you have taken too much of this medicine contact a poison control center or emergency room at once. ?NOTE: This medicine is only for you. Do not share this medicine with others. ?What if I miss a dose? ?If you miss a dose, take it as soon as you can. If it is almost  time for your next dose, take only that dose. Do not take double or extra doses. ?What may interact with this medication? ?Do not take this medication with any of the following: ?MAOIs like Carbex, Eldepryl, Marplan, Nardil, and Parnate ?This medication may also interact with the following: ?Alcohol or alcohol-containing products ?Antihistamines for allergy, cough, and cold ?Atropine ?Certain medications for anxiety or sleep ?Certain medications for bladder problems like oxybutynin, tolterodine ?Certain medications for depression like amitriptyline, fluoxetine, sertraline ?Certain medications for Parkinson disease like benztropine, trihexyphenidyl ?Certain medications for stomach problems like dicyclomine, hyoscyamine ?Certain medications for travel sickness like scopolamine ?Epinephrine ?General anesthetics like halothane, isoflurane, methoxyflurane, propofol ?Ipratropium ?Medications for depression, anxiety or psychotic disturbances ?Medications for high blood pressure ?Medications for seizures like phenobarbital, primidone, phenytoin ?Medications that relax muscles for surgery ?Metoclopramide ?Narcotic medications for pain ?This list may not describe all possible interactions. Give your health care provider a list of all the medicines, herbs, non-prescription drugs, or dietary supplements you use. Also tell them if you smoke, drink alcohol, or use illegal drugs. Some items may interact with your medicine. ?What should I watch for while using this medication? ?Tell your care team if your symptoms do not improve or if they get worse. ?You may get drowsy or dizzy. Do not drive, use machinery, or do anything that needs mental alertness until you know how this medication affects you. Do not stand or sit up quickly, especially if you are an older patient. This reduces the risk of dizzy or fainting spells. Alcohol may interfere with the effect of this medication. Avoid alcoholic drinks. ?This medication can  make you  more sensitive to the sun. Keep out of the sun. If you cannot avoid being in the sun, wear protective clothing and use sunscreen. Do not use sun lamps or tanning beds/booths. ?What side effects may I notice from receiving this medication? ?Side effects that you should report to your care team as soon as possible: ?Allergic reactions--skin rash, itching, hives, swelling of the face, lips, tongue, or throat ?CNS depression--slow or shallow breathing, shortness of breath, feeling faint, dizziness, confusion, trouble staying awake ?High fever, stiff muscles, increased sweating, fast or irregular heartbeat, and confusion, which may be signs of neuroleptic malignant syndrome ?Infection--fever, chills, cough, or sore throat ?Sudden eye pain or change in vision such as blurry vision, seeing halos around lights, vision loss ?Trouble passing urine ?Uncontrolled and repetitive body movements, muscle stiffness or spasms, tremors or shaking, loss of balance or coordination, restlessness, shuffling walk, which may be signs of extrapyramidal symptoms (EPS) ?Side effects that usually do not require medical attention (report to your care team if they continue or are bothersome): ?Confusion ?Constipation ?Dizziness ?Drowsiness ?Dry mouth ?Fatigue ?This list may not describe all possible side effects. Call your doctor for medical advice about side effects. You may report side effects to FDA at 1-800-FDA-1088. ?Where should I keep my medication? ?Keep out of the reach of children and pets. ?Store at room temperature between 20 and 25 degrees C (68 and 77 degrees F). Protect from light. Throw away any unused medication after the expiration date. ?NOTE: This sheet is a summary. It may not cover all possible information. If you have questions about this medicine, talk to your doctor, pharmacist, or health care provider. ?? 2023 Elsevier/Gold Standard (2020-12-24 00:00:00) ?Asthma, Adult ? ?Asthma is a long-term (chronic) condition that  causes recurrent episodes in which the lower airways in the lungs become tight and narrow. The narrowing is caused by inflammation and tightening of the smooth muscle around the lower airways. ?Asthma episodes, also called asthma attacks or asthma flares, may cause coughing, making high-pitched whistling sounds when you breathe, most often when you breathe out (wheezing), shortness of breath, and chest pain. The airways may produce extra mucus caused by the inflammation and irritation. During an attack, it can be difficult to breathe. Asthma attacks can range from minor to life-threatening. ?Asthma cannot be cured, but medicines and lifestyle changes can help control it and treat acute attacks. It is important to keep your asthma well controlled so the condition does not interfere with your daily life. ?What are the causes? ?This condition is believed to be caused by inherited (genetic) and environmental factors, but its exact cause is not known. ?What can trigger an asthma attack? ?Many things can bring on an asthma attack or make symptoms worse. These triggers are different for every person. Common triggers include: ?Allergens and irritants like mold, dust, pet dander, cockroaches, pollen, air pollution, and chemical odors. ?Cigarette smoke. ?Weather changes and cold air. ?Stress and strong emotional responses such as crying or laughing hard. ?Certain medications such as aspirin or beta blockers. ?Infections and inflammatory conditions, such as the flu, a cold, pneumonia, or inflammation of the nasal membranes (rhinitis). ?Gastroesophageal reflux disease (GERD). ?What are the signs or symptoms? ?Symptoms may occur right after exposure to an asthma trigger or hours later and can vary by person. Common signs and symptoms include: ?Wheezing. ?Trouble breathing (shortness of breath). ?Excessive nighttime or early morning coughing. ?Chest tightness. ?Tiredness (fatigue) with minimal activity. ?Difficulty talking in  complete sentences. ?  Poor exercise tolerance. ?How is this diagnosed? ?This condition is diagnosed based on: ?A physical exam and your medical history. ?Tests, which may include: ?Lung function studies to eval

## 2021-07-20 NOTE — Progress Notes (Signed)
?Virtual Visit Consent  ? ?Erin Hudson, you are scheduled for a virtual visit with a [facility] provider today.   ?  ?Just as with appointments in the office, your consent must be obtained to participate.  Your consent will be active for this visit and any virtual visit you may have with one of our providers in the next 365 days.   ?  ?If you have a MyChart account, a copy of this consent can be sent to you electronically.  All virtual visits are billed to your insurance company just like a traditional visit in the office.   ? ?As this is a virtual visit, video technology does not allow for your provider to perform a traditional examination.  This may limit your provider's ability to fully assess your condition.  If your provider identifies any concerns that need to be evaluated in person or the need to arrange testing (such as labs, EKG, etc.), we will make arrangements to do so.   ?  ?Although advances in technology are sophisticated, we cannot ensure that it will always work on either your end or our end.  If the connection with a video visit is poor, the visit may have to be switched to a telephone visit.  With either a video or telephone visit, we are not always able to ensure that we have a secure connection.    ? ?Also, by engaging in this virtual visit, you consent to the provision of healthcare. Additionally, you authorize for your insurance to be billed (if applicable) for the services provided during this visit.  ? ?I need to obtain your verbal consent now.   Are you willing to proceed with your visit today?  ?  ?ERICCA LABRA has provided verbal consent on 07/20/2021 for a virtual visit (video or telephone). ?  ?Freddy Finner, NP  ? ?Date: 07/20/2021 12:55 PM ? ? ?Virtual Visit via Video Note  ? ?I, Freddy Finner, connected with  Erin Hudson  ([id number], 12-22-1985) on 07/20/21 at  1:30 PM EDT by a video-enabled telemedicine application and verified that I am speaking with the correct person using two  identifiers. ? ?Location: ?Patient: Virtual Visit Location Patient: Home ?Provider: Virtual Visit Location Provider: Home Office ?  ?I discussed the limitations of evaluation and management by telemedicine and the availability of in person appointments. The patient expressed understanding and agreed to proceed.   ? ?History of Present Illness: ?Erin Hudson is a 36 y.o. who identifies as a female who was assigned female at birth, and is being seen today for asthma medication refill and treatment.  ? ?HPI: Asthma ?She complains of cough, sputum production and wheezing. There is no chest tightness, difficulty breathing, hoarse voice or shortness of breath. This is a chronic problem. The current episode started in the past 7 days. The problem has been waxing and waning. The cough is productive of sputum and productive. Associated symptoms include nasal congestion. Pertinent negatives include no appetite change, chest pain, dyspnea on exertion, ear congestion, ear pain, fever, headaches, PND, postnasal drip, rhinorrhea, sneezing, sore throat or trouble swallowing. Her symptoms are aggravated by change in weather. Her symptoms are alleviated by rest and OTC cough suppressant. She reports minimal improvement on treatment. Her symptoms are not alleviated by rest and OTC cough suppressant. There are no known risk factors for lung disease. Her past medical history is significant for asthma.   ?Problems:  ?Patient Active Problem List  ? Diagnosis  Date Noted  ? Allergic rhinitis 06/02/2015  ? AB (asthmatic bronchitis) 06/02/2015  ? Contraception management 06/02/2015  ? Dysmenorrhea 06/02/2015  ? Irregular menstrual cycle 06/02/2015  ? Vitamin D deficiency 11/27/2008  ? Acne 07/24/2008  ? Pityriasis rosea 07/24/2008  ? CD (contact dermatitis) 07/16/2008  ?  ?Allergies: No Known Allergies ?Medications:  ?Current Outpatient Medications:  ?  albuterol (PROVENTIL) (2.5 MG/3ML) 0.083% nebulizer solution, USE 1 VIAL BY NEBULIZATION  EVERY 6 HOURS AS NEEDED FOR WHEEZING OR SHORTNES OF BREATH, Disp: 75 mL, Rfl: 3 ?  albuterol (VENTOLIN HFA) 108 (90 Base) MCG/ACT inhaler, Inhale 1-2 puffs into the lungs every 6 (six) hours as needed for wheezing or shortness of breath., Disp: 18 g, Rfl: 3 ?  cyclobenzaprine (FLEXERIL) 10 MG tablet, Take 1 tablet (10 mg total) by mouth 3 (three) times daily as needed for muscle spasms., Disp: 30 tablet, Rfl: 0 ?  meloxicam (MOBIC) 15 MG tablet, Take 1 tablet (15 mg total) by mouth daily., Disp: 30 tablet, Rfl: 0 ?  promethazine-dextromethorphan (PROMETHAZINE-DM) 6.25-15 MG/5ML syrup, Take 5 mLs by mouth 4 (four) times daily as needed for cough., Disp: 118 mL, Rfl: 0 ? ?Observations/Objective: ?Patient is well-developed, well-nourished in no acute distress.  ?Resting comfortably  at home.  ?Head is normocephalic, atraumatic.  ?No labored breathing. Talking in complete sentences ?Speech is clear and coherent with logical content.  ?Patient is alert and oriented at baseline.  ? ? ?Assessment and Plan: ?1. Asthmatic bronchitis without complication, unspecified asthma severity, unspecified whether persistent ? ?- albuterol (PROVENTIL) (2.5 MG/3ML) 0.083% nebulizer solution; USE 1 VIAL BY NEBULIZATION EVERY 6 HOURS AS NEEDED FOR WHEEZING OR SHORTNES OF BREATH  Dispense: 75 mL; Refill: 0 ?- albuterol (VENTOLIN HFA) 108 (90 Base) MCG/ACT inhaler; Inhale 1-2 puffs into the lungs every 6 (six) hours as needed for wheezing or shortness of breath.  Dispense: 18 g; Refill: 0 ?- promethazine-dextromethorphan (PROMETHAZINE-DM) 6.25-15 MG/5ML syrup; Take 2.5 mLs by mouth 2 (two) times daily as needed for cough.  Dispense: 118 mL; Refill: 0 ? ?2. Cough due to bronchospasm ? ?- promethazine-dextromethorphan (PROMETHAZINE-DM) 6.25-15 MG/5ML syrup; Take 2.5 mLs by mouth 2 (two) times daily as needed for cough.  Dispense: 118 mL; Refill: 0 ? ?-S&S are consistent with asthma and flare ?-treatment as per above and additional prometh dm  cough syrup for allergie congestion driven bronchospasms. ?-rest, hydration, and use of albuterol meds reviewed  ?-follow up with pcp for on going refills and treatment advised  ? ? ?Follow Up Instructions: ?I discussed the assessment and treatment plan with the patient. The patient was provided an opportunity to ask questions and all were answered. The patient agreed with the plan and demonstrated an understanding of the instructions.  A copy of instructions were sent to the patient via MyChart unless otherwise noted below.  ? ? ? ?The patient was advised to call back or seek an in-person evaluation if the symptoms worsen or if the condition fails to improve as anticipated. ? ?Time:  ?I spent 15 minutes with the patient via telehealth technology discussing the above problems/concerns.   ? ?Freddy Finner, NP ? ?

## 2021-12-10 ENCOUNTER — Other Ambulatory Visit (INDEPENDENT_AMBULATORY_CARE_PROVIDER_SITE_OTHER): Payer: Self-pay | Admitting: Family Medicine

## 2021-12-10 DIAGNOSIS — J45909 Unspecified asthma, uncomplicated: Secondary | ICD-10-CM

## 2021-12-25 ENCOUNTER — Other Ambulatory Visit: Payer: Self-pay | Admitting: Family Medicine

## 2021-12-25 DIAGNOSIS — J45909 Unspecified asthma, uncomplicated: Secondary | ICD-10-CM

## 2021-12-27 ENCOUNTER — Telehealth: Payer: Self-pay | Admitting: Physician Assistant

## 2021-12-27 DIAGNOSIS — J9801 Acute bronchospasm: Secondary | ICD-10-CM

## 2021-12-27 DIAGNOSIS — J45909 Unspecified asthma, uncomplicated: Secondary | ICD-10-CM

## 2021-12-27 MED ORDER — PROMETHAZINE-DM 6.25-15 MG/5ML PO SYRP: 2.5000 mL | ORAL_SOLUTION | Freq: Two times a day (BID) | ORAL | 0 refills | Status: DC | PRN

## 2021-12-27 MED ORDER — PREDNISONE 20 MG PO TABS: 40.0000 mg | ORAL_TABLET | Freq: Every day | ORAL | 0 refills | Status: DC

## 2021-12-27 MED ORDER — ALBUTEROL SULFATE (2.5 MG/3ML) 0.083% IN NEBU: INHALATION_SOLUTION | RESPIRATORY_TRACT | 0 refills | Status: DC

## 2021-12-27 MED ORDER — ALBUTEROL SULFATE HFA 108 (90 BASE) MCG/ACT IN AERS: 1.0000 | INHALATION_SPRAY | Freq: Four times a day (QID) | RESPIRATORY_TRACT | 0 refills | Status: DC | PRN

## 2021-12-27 NOTE — Patient Instructions (Signed)
Erin Hudson, thank you for joining Margaretann Loveless, PA-C for today's virtual visit.  While this provider is not your primary care provider (PCP), if your PCP is located in our provider database this encounter information will be shared with them immediately following your visit.  Consent: (Patient) Erin Hudson provided verbal consent for this virtual visit at the beginning of the encounter.  Current Medications:  Current Outpatient Medications:    predniSONE (DELTASONE) 20 MG tablet, Take 2 tablets (40 mg total) by mouth daily with breakfast., Disp: 10 tablet, Rfl: 0   albuterol (PROVENTIL) (2.5 MG/3ML) 0.083% nebulizer solution, USE 1 VIAL BY NEBULIZATION EVERY 6 HOURS AS NEEDED FOR WHEEZING OR SHORTNES OF BREATH, Disp: 75 mL, Rfl: 0   albuterol (VENTOLIN HFA) 108 (90 Base) MCG/ACT inhaler, Inhale 1-2 puffs into the lungs every 6 (six) hours as needed for wheezing or shortness of breath., Disp: 18 g, Rfl: 0   cyclobenzaprine (FLEXERIL) 10 MG tablet, Take 1 tablet (10 mg total) by mouth 3 (three) times daily as needed for muscle spasms., Disp: 30 tablet, Rfl: 0   meloxicam (MOBIC) 15 MG tablet, Take 1 tablet (15 mg total) by mouth daily., Disp: 30 tablet, Rfl: 0   promethazine-dextromethorphan (PROMETHAZINE-DM) 6.25-15 MG/5ML syrup, Take 2.5 mLs by mouth 2 (two) times daily as needed for cough., Disp: 118 mL, Rfl: 0   Medications ordered in this encounter:  Meds ordered this encounter  Medications   albuterol (PROVENTIL) (2.5 MG/3ML) 0.083% nebulizer solution    Sig: USE 1 VIAL BY NEBULIZATION EVERY 6 HOURS AS NEEDED FOR WHEEZING OR SHORTNES OF BREATH    Dispense:  75 mL    Refill:  0    Order Specific Question:   Supervising Provider    Answer:   Merrilee Jansky [[id number]]   albuterol (VENTOLIN HFA) 108 (90 Base) MCG/ACT inhaler    Sig: Inhale 1-2 puffs into the lungs every 6 (six) hours as needed for wheezing or shortness of breath.    Dispense:  18 g    Refill:  0    Order  Specific Question:   Supervising Provider    Answer:   Merrilee Jansky [id number]   promethazine-dextromethorphan (PROMETHAZINE-DM) 6.25-15 MG/5ML syrup    Sig: Take 2.5 mLs by mouth 2 (two) times daily as needed for cough.    Dispense:  118 mL    Refill:  0    Order Specific Question:   Supervising Provider    Answer:   Merrilee Jansky [[id number]]   predniSONE (DELTASONE) 20 MG tablet    Sig: Take 2 tablets (40 mg total) by mouth daily with breakfast.    Dispense:  10 tablet    Refill:  0    Order Specific Question:   Supervising Provider    Answer:   Merrilee Jansky [[id number]]     *If you need refills on other medications prior to your next appointment, please contact your pharmacy*  Follow-Up: Call back or seek an in-person evaluation if the symptoms worsen or if the condition fails to improve as anticipated.  [facility] [phone]  Other Instructions Asthma, Adult  Asthma is a long-term (chronic) condition that causes recurrent episodes in which the lower airways in the lungs become tight and narrow. The narrowing is caused by inflammation and tightening of the smooth muscle around the lower airways. Asthma episodes, also called asthma attacks or asthma flares, may cause coughing, making high-pitched whistling sounds when you  breathe, most often when you breathe out (wheezing), shortness of breath, and chest pain. The airways may produce extra mucus caused by the inflammation and irritation. During an attack, it can be difficult to breathe. Asthma attacks can range from minor to life-threatening. Asthma cannot be cured, but medicines and lifestyle changes can help control it and treat acute attacks. It is important to keep your asthma well controlled so the condition does not interfere with your daily life. What are the causes? This condition is believed to be caused by inherited (genetic) and environmental factors, but its exact cause is not known. What can  trigger an asthma attack? Many things can bring on an asthma attack or make symptoms worse. These triggers are different for every person. Common triggers include: Allergens and irritants like mold, dust, pet dander, cockroaches, pollen, air pollution, and chemical odors. Cigarette smoke. Weather changes and cold air. Stress and strong emotional responses such as crying or laughing hard. Certain medications such as aspirin or beta blockers. Infections and inflammatory conditions, such as the flu, a cold, pneumonia, or inflammation of the nasal membranes (rhinitis). Gastroesophageal reflux disease (GERD). What are the signs or symptoms? Symptoms may occur right after exposure to an asthma trigger or hours later and can vary by person. Common signs and symptoms include: Wheezing. Trouble breathing (shortness of breath). Excessive nighttime or early morning coughing. Chest tightness. Tiredness (fatigue) with minimal activity. Difficulty talking in complete sentences. Poor exercise tolerance. How is this diagnosed? This condition is diagnosed based on: A physical exam and your medical history. Tests, which may include: Lung function studies to evaluate the flow of air in your lungs. Allergy tests. Imaging tests, such as X-rays. How is this treated? There is no cure, but symptoms can be controlled with proper treatment. Treatment usually involves: Identifying and avoiding your asthma triggers. Inhaled medicines. Two types are commonly used to treat asthma, depending on severity: Controller medicines. These help prevent asthma symptoms from occurring. They are taken every day. Fast-acting reliever or rescue medicines. These quickly relieve asthma symptoms. They are used as needed and provide short-term relief. Using other medicines, such as: Allergy medicines, such as antihistamines, if your asthma attacks are triggered by allergens. Immune medicines (immunomodulators). These are medicines  that help control the immune system. Using supplemental oxygen. This is only needed during a severe episode. Creating an asthma action plan. An asthma action plan is a written plan for managing and treating your asthma attacks. This plan includes: A list of your asthma triggers and how to avoid them. Information about when medicines should be taken and when their dosage should be changed. Instructions about using a device called a peak flow meter. A peak flow meter measures how well the lungs are working and the severity of your asthma. It helps you monitor your condition. Follow these instructions at home: Take over-the-counter and prescription medicines only as told by your health care provider. Stay up to date on all vaccinations as recommended by your healthcare provider, including vaccines for the flu and pneumonia. Use a peak flow meter and keep track of your peak flow readings. Understand and use your asthma action plan to address any asthma flares. Do not smoke or allow anyone to smoke in your home. Contact a health care provider if: You have wheezing, shortness of breath, or a cough that is not responding to medicines. Your medicines are causing side effects, such as a rash, itching, swelling, or trouble breathing. You need to use a  reliever medicine more than 2-3 times a week. Your peak flow reading is still at 50-79% of your personal best after following your action plan for 1 hour. You have a fever and shortness of breath. Get help right away if: You are getting worse and do not respond to treatment during an asthma attack. You are short of breath when at rest or when doing very little physical activity. You have difficulty eating, drinking, or talking. You have chest pain or tightness. You develop a fast heartbeat or palpitations. You have a bluish color to your lips or fingernails. You are light-headed or dizzy, or you faint. Your peak flow reading is less than 50% of your  personal best. You feel too tired to breathe normally. These symptoms may be an emergency. Get help right away. Call 911. Do not wait to see if the symptoms will go away. Do not drive yourself to the hospital. Summary Asthma is a long-term (chronic) condition that causes recurrent episodes in which the airways become tight and narrow. Asthma episodes, also called asthma attacks or asthma flares, can cause coughing, wheezing, shortness of breath, and chest pain. Asthma cannot be cured, but medicines and lifestyle changes can help keep it well controlled and prevent asthma flares. Make sure you understand how to avoid triggers and how and when to use your medicines. Asthma attacks can range from minor to life-threatening. Get help right away if you have an asthma attack and do not respond to treatment with your usual rescue medicines. This information is not intended to replace advice given to you by your health care provider. Make sure you discuss any questions you have with your health care provider. Document Revised: 12/30/2020 Document Reviewed: 12/21/2020 Elsevier Patient Education  [employer].    If you have been instructed to have an in-person evaluation today at a local Urgent Care facility, please use the link below. It will take you to a list of all of our available [facility], including address, phone number and hours of operation. Please do not delay care.  [facility]  If you or a family member do not have a primary care provider, use the link below to schedule a visit and establish care. When you choose a [facility] primary care physician or advanced practice provider, you gain a long-term partner in health. Find a Primary Care Provider  Learn more about [facility]'s in-office and virtual care options: [facility]

## 2021-12-27 NOTE — Progress Notes (Signed)
Virtual Visit Consent   Erin Hudson, you are scheduled for a virtual visit with a [facility] provider today. Just as with appointments in the office, your consent must be obtained to participate. Your consent will be active for this visit and any virtual visit you may have with one of our providers in the next 365 days. If you have a MyChart account, a copy of this consent can be sent to you electronically.  As this is a virtual visit, video technology does not allow for your provider to perform a traditional examination. This may limit your provider's ability to fully assess your condition. If your provider identifies any concerns that need to be evaluated in person or the need to arrange testing (such as labs, EKG, etc.), we will make arrangements to do so. Although advances in technology are sophisticated, we cannot ensure that it will always work on either your end or our end. If the connection with a video visit is poor, the visit may have to be switched to a telephone visit. With either a video or telephone visit, we are not always able to ensure that we have a secure connection.  By engaging in this virtual visit, you consent to the provision of healthcare and authorize for your insurance to be billed (if applicable) for the services provided during this visit. Depending on your insurance coverage, you may receive a charge related to this service.  I need to obtain your verbal consent now. Are you willing to proceed with your visit today? Erin Hudson has provided verbal consent on 12/27/2021 for a virtual visit (video or telephone). Mar Daring, PA-C  Date: 12/27/2021 8:55 AM  Virtual Visit via Video Note   I, Mar Daring, connected with  Erin Hudson  ([id number], Sep 17, 1985) on 12/27/21 at  8:45 AM EDT by a video-enabled telemedicine application and verified that I am speaking with the correct person using two identifiers.  Location: Patient: Virtual Visit Location Patient:  Home Provider: Virtual Visit Location Provider: Home Office   I discussed the limitations of evaluation and management by telemedicine and the availability of in person appointments. The patient expressed understanding and agreed to proceed.    History of Present Illness: Erin Hudson is a 36 y.o. who identifies as a female who was assigned female at birth, and is being seen today for asthma.  HPI: Asthma She complains of chest tightness, cough, shortness of breath and wheezing. There is no difficulty breathing or hoarse voice. This is a recurrent problem. The current episode started 1 to 4 weeks ago. The problem occurs 2 to 4 times per day. The problem has been gradually worsening. The cough is non-productive. Associated symptoms include chest pain (tightness), dyspnea on exertion and malaise/fatigue. Pertinent negatives include no fever, headaches, myalgias, nasal congestion, postnasal drip, rhinorrhea, sneezing or sore throat. Her symptoms are aggravated by change in weather. Her symptoms are alleviated by nothing. She reports no improvement on treatment. Her symptoms are not alleviated by beta-agonist. Her past medical history is significant for asthma.      Problems:  Patient Active Problem List   Diagnosis Date Noted   Allergic rhinitis 06/02/2015   AB (asthmatic bronchitis) 06/02/2015   Contraception management 06/02/2015   Dysmenorrhea 06/02/2015   Irregular menstrual cycle 06/02/2015   Vitamin D deficiency 11/27/2008   Acne 07/24/2008   Pityriasis rosea 07/24/2008   CD (contact dermatitis) 07/16/2008    Allergies: No Known Allergies Medications:  Current Outpatient  Medications:    predniSONE (DELTASONE) 20 MG tablet, Take 2 tablets (40 mg total) by mouth daily with breakfast., Disp: 10 tablet, Rfl: 0   albuterol (PROVENTIL) (2.5 MG/3ML) 0.083% nebulizer solution, USE 1 VIAL BY NEBULIZATION EVERY 6 HOURS AS NEEDED FOR WHEEZING OR SHORTNES OF BREATH, Disp: 75 mL, Rfl: 0    albuterol (VENTOLIN HFA) 108 (90 Base) MCG/ACT inhaler, Inhale 1-2 puffs into the lungs every 6 (six) hours as needed for wheezing or shortness of breath., Disp: 18 g, Rfl: 0   cyclobenzaprine (FLEXERIL) 10 MG tablet, Take 1 tablet (10 mg total) by mouth 3 (three) times daily as needed for muscle spasms., Disp: 30 tablet, Rfl: 0   meloxicam (MOBIC) 15 MG tablet, Take 1 tablet (15 mg total) by mouth daily., Disp: 30 tablet, Rfl: 0   promethazine-dextromethorphan (PROMETHAZINE-DM) 6.25-15 MG/5ML syrup, Take 2.5 mLs by mouth 2 (two) times daily as needed for cough., Disp: 118 mL, Rfl: 0  Observations/Objective: Patient is well-developed, well-nourished in no acute distress.  Resting comfortably at home.  Head is normocephalic, atraumatic.  No labored breathing.  Speech is clear and coherent with logical content.  Patient is alert and oriented at baseline.    Assessment and Plan: 1. Asthmatic bronchitis without complication, unspecified asthma severity, unspecified whether persistent - albuterol (PROVENTIL) (2.5 MG/3ML) 0.083% nebulizer solution; USE 1 VIAL BY NEBULIZATION EVERY 6 HOURS AS NEEDED FOR WHEEZING OR SHORTNES OF BREATH  Dispense: 75 mL; Refill: 0 - albuterol (VENTOLIN HFA) 108 (90 Base) MCG/ACT inhaler; Inhale 1-2 puffs into the lungs every 6 (six) hours as needed for wheezing or shortness of breath.  Dispense: 18 g; Refill: 0 - promethazine-dextromethorphan (PROMETHAZINE-DM) 6.25-15 MG/5ML syrup; Take 2.5 mLs by mouth 2 (two) times daily as needed for cough.  Dispense: 118 mL; Refill: 0 - predniSONE (DELTASONE) 20 MG tablet; Take 2 tablets (40 mg total) by mouth daily with breakfast.  Dispense: 10 tablet; Refill: 0  2. Cough due to bronchospasm - promethazine-dextromethorphan (PROMETHAZINE-DM) 6.25-15 MG/5ML syrup; Take 2.5 mLs by mouth 2 (two) times daily as needed for cough.  Dispense: 118 mL; Refill: 0  - Worsening over a week  - Will treat with Prednisone burst and Promethazine  DM - Albuterol inhaler and nebulizer refilled - Can continue Mucinex  - Push fluids.  - Rest.  - Steam and humidifier can help - Seek in person evaluation if worsening or symptoms fail to improve    Follow Up Instructions: I discussed the assessment and treatment plan with the patient. The patient was provided an opportunity to ask questions and all were answered. The patient agreed with the plan and demonstrated an understanding of the instructions.  A copy of instructions were sent to the patient via MyChart unless otherwise noted below.    The patient was advised to call back or seek an in-person evaluation if the symptoms worsen or if the condition fails to improve as anticipated.  Time:  I spent 10 minutes with the patient via telehealth technology discussing the above problems/concerns.    Margaretann Loveless, PA-C

## 2022-02-26 ENCOUNTER — Other Ambulatory Visit (INDEPENDENT_AMBULATORY_CARE_PROVIDER_SITE_OTHER): Payer: Self-pay | Admitting: Physician Assistant

## 2022-02-26 DIAGNOSIS — J45909 Unspecified asthma, uncomplicated: Secondary | ICD-10-CM

## 2022-03-03 ENCOUNTER — Telehealth: Payer: Self-pay | Admitting: Physician Assistant

## 2022-03-03 DIAGNOSIS — J4521 Mild intermittent asthma with (acute) exacerbation: Secondary | ICD-10-CM

## 2022-03-03 MED ORDER — AZITHROMYCIN 250 MG PO TABS: ORAL_TABLET | ORAL | 0 refills | Status: AC

## 2022-03-03 MED ORDER — PROMETHAZINE-DM 6.25-15 MG/5ML PO SYRP: 5.0000 mL | ORAL_SOLUTION | Freq: Four times a day (QID) | ORAL | 0 refills | Status: DC | PRN

## 2022-03-03 MED ORDER — PREDNISONE 20 MG PO TABS: 40.0000 mg | ORAL_TABLET | Freq: Every day | ORAL | 0 refills | Status: DC

## 2022-03-03 MED ORDER — ALBUTEROL SULFATE (2.5 MG/3ML) 0.083% IN NEBU: INHALATION_SOLUTION | RESPIRATORY_TRACT | 0 refills | Status: DC

## 2022-03-03 MED ORDER — ALBUTEROL SULFATE HFA 108 (90 BASE) MCG/ACT IN AERS: 1.0000 | INHALATION_SPRAY | Freq: Four times a day (QID) | RESPIRATORY_TRACT | 0 refills | Status: DC | PRN

## 2022-03-03 NOTE — Progress Notes (Signed)
Virtual Visit Consent   Erin Hudson, you are scheduled for a virtual visit with a [facility] provider today. Just as with appointments in the office, your consent must be obtained to participate. Your consent will be active for this visit and any virtual visit you may have with one of our providers in the next 365 days. If you have a MyChart account, a copy of this consent can be sent to you electronically.  As this is a virtual visit, video technology does not allow for your provider to perform a traditional examination. This may limit your provider's ability to fully assess your condition. If your provider identifies any concerns that need to be evaluated in person or the need to arrange testing (such as labs, EKG, etc.), we will make arrangements to do so. Although advances in technology are sophisticated, we cannot ensure that it will always work on either your end or our end. If the connection with a video visit is poor, the visit may have to be switched to a telephone visit. With either a video or telephone visit, we are not always able to ensure that we have a secure connection.  By engaging in this virtual visit, you consent to the provision of healthcare and authorize for your insurance to be billed (if applicable) for the services provided during this visit. Depending on your insurance coverage, you may receive a charge related to this service.  I need to obtain your verbal consent now. Are you willing to proceed with your visit today? Erin Hudson has provided verbal consent on 03/03/2022 for a virtual visit (video or telephone). Piedad Climes, [place]  Date: 03/03/2022 1:37 PM  Virtual Visit via Video Note   I, Piedad Climes, connected with  Erin Hudson  ([id number], [birth year]) on 03/03/22 at  1:30 PM EST by a video-enabled telemedicine application and verified that I am speaking with the correct person using two identifiers.  Location: Patient: Virtual Visit Location Patient:  Other: Work Provider: Pharmacist, community: Home Office   I discussed the limitations of evaluation and management by telemedicine and the availability of in person appointments. The patient expressed understanding and agreed to proceed.    History of Present Illness: Erin Hudson is a 36 y.o. who identifies as a female who was assigned female at birth, and is being seen today for 1+ weeks of chest congestion and cough that has progressively worsened over the past 2 days. Notes having to use her SABA more frequently. Is out of her albuterol the past couple of days. Denies fever, chest pain.   No sick contact.     HPI: HPI  Problems:  Patient Active Problem List   Diagnosis Date Noted   Allergic rhinitis 06/02/2015   AB (asthmatic bronchitis) 06/02/2015   Contraception management 06/02/2015   Dysmenorrhea 06/02/2015   Irregular menstrual cycle 06/02/2015   Vitamin D deficiency 11/27/2008   Acne 07/24/2008   Pityriasis rosea 07/24/2008   CD (contact dermatitis) 07/16/2008    Allergies: No Known Allergies Medications:  Current Outpatient Medications:    azithromycin (ZITHROMAX) 250 MG tablet, Take 2 tablets on day 1, then 1 tablet daily on days 2 through 5, Disp: 6 tablet, Rfl: 0   predniSONE (DELTASONE) 20 MG tablet, Take 2 tablets (40 mg total) by mouth daily with breakfast., Disp: 10 tablet, Rfl: 0   promethazine-dextromethorphan (PROMETHAZINE-DM) 6.25-15 MG/5ML syrup, Take 5 mLs by mouth 4 (four) times daily as needed for cough.,  Disp: 118 mL, Rfl: 0   albuterol (PROVENTIL) (2.5 MG/3ML) 0.083% nebulizer solution, USE 1 VIAL BY NEBULIZATION EVERY 6 HOURS AS NEEDED FOR WHEEZING OR SHORTNES OF BREATH, Disp: 75 mL, Rfl: 0   albuterol (VENTOLIN HFA) 108 (90 Base) MCG/ACT inhaler, Inhale 1-2 puffs into the lungs every 6 (six) hours as needed for wheezing or shortness of breath., Disp: 18 g, Rfl: 0  Observations/Objective: Patient is well-developed, well-nourished in no acute  distress.  Resting comfortably at home.  Head is normocephalic, atraumatic.  No labored breathing. Speech is clear and coherent with logical content.  Patient is alert and oriented at baseline.   Assessment and Plan: 1. Mild intermittent asthmatic bronchitis with acute exacerbation - promethazine-dextromethorphan (PROMETHAZINE-DM) 6.25-15 MG/5ML syrup; Take 5 mLs by mouth 4 (four) times daily as needed for cough.  Dispense: 118 mL; Refill: 0 - azithromycin (ZITHROMAX) 250 MG tablet; Take 2 tablets on day 1, then 1 tablet daily on days 2 through 5  Dispense: 6 tablet; Refill: 0 - predniSONE (DELTASONE) 20 MG tablet; Take 2 tablets (40 mg total) by mouth daily with breakfast.  Dispense: 10 tablet; Refill: 0 - albuterol (PROVENTIL) (2.5 MG/3ML) 0.083% nebulizer solution; USE 1 VIAL BY NEBULIZATION EVERY 6 HOURS AS NEEDED FOR WHEEZING OR SHORTNES OF BREATH  Dispense: 75 mL; Refill: 0 - albuterol (VENTOLIN HFA) 108 (90 Base) MCG/ACT inhaler; Inhale 1-2 puffs into the lungs every 6 (six) hours as needed for wheezing or shortness of breath.  Dispense: 18 g; Refill: 0  Supportive measures and OTC medications reviewed. Refilled her albuterol. Tessalon, Azithromycin per orders. The Prednisone burst is to start if breathing worsening despite restarting her albuterol. In person evaluation precautions reviewed.   Follow Up Instructions: I discussed the assessment and treatment plan with the patient. The patient was provided an opportunity to ask questions and all were answered. The patient agreed with the plan and demonstrated an understanding of the instructions.  A copy of instructions were sent to the patient via MyChart unless otherwise noted below.   The patient was advised to call back or seek an in-person evaluation if the symptoms worsen or if the condition fails to improve as anticipated.  Time:  I spent 10 minutes with the patient via telehealth technology discussing the above problems/concerns.     Piedad Climes, PA-C

## 2022-03-03 NOTE — Patient Instructions (Signed)
Erin Hudson, thank you for joining Piedad Climes, PA-C for today's virtual visit.  While this provider is not your primary care provider (PCP), if your PCP is located in our provider database this encounter information will be shared with them immediately following your visit.   A [facility] MyChart account gives you access to today's visit and all your visits, tests, and labs performed at [facility] " click here if you don't have a [facility] MyChart account or go to [email or web address]  Consent: (Patient) Erin Hudson provided verbal consent for this virtual visit at the beginning of the encounter.  Current Medications:  Current Outpatient Medications:    albuterol (PROVENTIL) (2.5 MG/3ML) 0.083% nebulizer solution, USE 1 VIAL BY NEBULIZATION EVERY 6 HOURS AS NEEDED FOR WHEEZING OR SHORTNES OF BREATH, Disp: 75 mL, Rfl: 0   albuterol (VENTOLIN HFA) 108 (90 Base) MCG/ACT inhaler, Inhale 1-2 puffs into the lungs every 6 (six) hours as needed for wheezing or shortness of breath., Disp: 18 g, Rfl: 0   cyclobenzaprine (FLEXERIL) 10 MG tablet, Take 1 tablet (10 mg total) by mouth 3 (three) times daily as needed for muscle spasms., Disp: 30 tablet, Rfl: 0   meloxicam (MOBIC) 15 MG tablet, Take 1 tablet (15 mg total) by mouth daily., Disp: 30 tablet, Rfl: 0   predniSONE (DELTASONE) 20 MG tablet, Take 2 tablets (40 mg total) by mouth daily with breakfast., Disp: 10 tablet, Rfl: 0   promethazine-dextromethorphan (PROMETHAZINE-DM) 6.25-15 MG/5ML syrup, Take 2.5 mLs by mouth 2 (two) times daily as needed for cough., Disp: 118 mL, Rfl: 0   Medications ordered in this encounter:  No orders of the defined types were placed in this encounter.    *If you need refills on other medications prior to your next appointment, please contact your pharmacy*  Follow-Up: Call back or seek an in-person evaluation if the symptoms worsen or if the condition fails to improve as  anticipated.  [facility] [phone]  Other Instructions Take antibiotic (Azithromycin) as directed.  Increase fluids.  Get plenty of rest. Use Mucinex for congestion. Restart your albuterol. The cough syrup can be taken with plain Mucinex. If no substantial improvement in chest tightness after resuming your albuterol, use the prednisone as directed. Take a daily probiotic (I recommend Align or Culturelle, but even Activia Yogurt may be beneficial).  A humidifier placed in the bedroom may offer some relief for a dry, scratchy throat of nasal irritation.  Read information below on acute bronchitis. Please call or return to clinic if symptoms are not improving.  Acute Bronchitis Bronchitis is when the airways that extend from the windpipe into the lungs get red, puffy, and painful (inflamed). Bronchitis often causes thick spit (mucus) to develop. This leads to a cough. A cough is the most common symptom of bronchitis. In acute bronchitis, the condition usually begins suddenly and goes away over time (usually in 2 weeks). Smoking, allergies, and asthma can make bronchitis worse. Repeated episodes of bronchitis may cause more lung problems.  HOME CARE Rest. Drink enough fluids to keep your pee (urine) clear or pale yellow (unless you need to limit fluids as told by your doctor). Only take over-the-counter or prescription medicines as told by your doctor. Avoid smoking and secondhand smoke. These can make bronchitis worse. If you are a smoker, think about using nicotine gum or skin patches. Quitting smoking will help your lungs heal faster. Reduce the chance of getting bronchitis again by: Washing  your hands often. Avoiding people with cold symptoms. Trying not to touch your hands to your mouth, nose, or eyes. Follow up with your doctor as told.  GET HELP IF: Your symptoms do not improve after 1 week of treatment. Symptoms include: Cough. Fever. Coughing up thick spit. Body  aches. Chest congestion. Chills. Shortness of breath. Sore throat.  GET HELP RIGHT AWAY IF:  You have an increased fever. You have chills. You have severe shortness of breath. You have bloody thick spit (sputum). You throw up (vomit) often. You lose too much body fluid (dehydration). You have a severe headache. You faint.  MAKE SURE YOU:  Understand these instructions. Will watch your condition. Will get help right away if you are not doing well or get worse. Document Released: 08/31/2007 Document Revised: 11/14/2012 Document Reviewed: 09/04/2012 [facility] Patient Information 2015 [place], [place]. This information is not intended to replace advice given to you by your health care provider. Make sure you discuss any questions you have with your health care provider.    If you have been instructed to have an in-person evaluation today at a local Urgent Care facility, please use the link below. It will take you to a list of all of our available [facility], including address, phone number and hours of operation. Please do not delay care.  [facility]  If you or a family member do not have a primary care provider, use the link below to schedule a visit and establish care. When you choose a [facility] primary care physician or advanced practice provider, you gain a long-term partner in health. Find a Primary Care Provider  Learn more about [facility]'s in-office and virtual care options: [facility]

## 2022-03-04 ENCOUNTER — Ambulatory Visit: Payer: Self-pay

## 2022-03-04 NOTE — Telephone Encounter (Signed)
  Chief Complaint: medication problem Symptoms: nausea Frequency: yesterday Pertinent Negatives: Patient denies vomiting  Disposition: [] ED /[] Urgent Care (no appt availability in office) / [] Appointment(In office/virtual)/ []  [facility]/ [x] Home Care/ [] Refused Recommended Disposition /[] [facility]/ []  Follow-up with PCP Additional Notes: called pt, states she has taken yesterday and today dose and ate each time and gets nauseated with medication. Recommended pt to try OTC medication for nausea first and if no improvement then call back and can see how we can get her prescribed something for nausea since virtual UC provider prescribed abx.   Summary: nauseated with medication   Pt called saying the antibiotic that she is taking is making her very nauseated.  She took with food but still make her feel sick.  CB#  [phone]         Reason for Disposition  Caller has medicine question only, adult not sick, AND triager answers question  Answer Assessment - Initial Assessment Questions 1. NAME of MEDICINE: "What medicine(s) are you calling about?"     Azithromycin  2. QUESTION: "What is your question?" (e.g., double dose of medicine, side effect)     Making her nausea  3. PRESCRIBER: "Who prescribed the medicine?" Reason: if prescribed by specialist, call should be referred to that group.     Virtual UC provider  4. SYMPTOMS: "Do you have any symptoms?" If Yes, ask: "What symptoms are you having?"  "How bad are the symptoms (e.g., mild, moderate, severe)     [place]a  Protocols used: Medication Question Call-A-AH

## 2022-04-13 NOTE — Progress Notes (Signed)
I,[name],acting as a Education administrator for [employer], PA-C.,have documented all relevant documentation on the behalf of Erin Speak, PA-C,as directed by  [employer], PA-C while in the presence of [employer], PA-C.    Established patient visit   Patient: Erin Hudson   DOB: 21-Nov-1985   37 y.o. Female  MRN: [id number] Visit Date: 04/14/2022  Today's healthcare provider: [name], PA-C   CC: reestablish care, Asthma FU  Subjective    HPI   Birth control refill Has been on current OC for almost a year; denies having any side effects, endorses having very light menses monthly on OC .  Very satisfied with her current contraceptives and would like to continue on the same medication. -Pap Smear: was normal, does not remember the date of her last PAP smear.  Asthma Follow-up: She has previously been evaluated here for asthma and presents for an asthma follow-up; she is not currently in exacerbation. Symptoms currently include dyspnea, non-productive cough, and wheezing and occur less than 2x/week. Observed precipitants include cold air.  Current limitations in activity from asthma: none.  Number of days of school or work missed in the last month: not applicable. Number of Emergency Department visits in the previous month: none. Frequency of use of quick-relief meds: once weekly. The patient reports adherence to this regimen   Allergic Rhinitis:Patient's symptoms include cough, itchy eyes, itchy nose, postnasal drip, and sneezing. These symptoms are seasonal. Current triggers include exposure to pollens and grass, certain trees .   Reports having allergic reaction after  consumption of shrimps on a few occasions .  States that she did have only skin reaction and not lip and mouth swellings or any breathing  problems.   Medications: Outpatient Medications Prior to Visit  Medication Sig   albuterol (PROVENTIL) (2.5 MG/3ML) 0.083% nebulizer solution USE 1 VIAL BY NEBULIZATION  EVERY 6 HOURS AS NEEDED FOR WHEEZING OR SHORTNES OF BREATH   albuterol (VENTOLIN HFA) 108 (90 Base) MCG/ACT inhaler Inhale 1-2 puffs into the lungs every 6 (six) hours as needed for wheezing or shortness of breath.   predniSONE (DELTASONE) 20 MG tablet Take 2 tablets (40 mg total) by mouth daily with breakfast.   promethazine-dextromethorphan (PROMETHAZINE-DM) 6.25-15 MG/5ML syrup Take 5 mLs by mouth 4 (four) times daily as needed for cough.   No facility-administered medications prior to visit.    Review of Systems Except see HPI    Objective    There were no vitals taken for this visit.   Physical Exam Vitals reviewed.  Constitutional:      General: She is not in acute distress.    Appearance: Normal appearance. She is well-developed. She is not diaphoretic.  HENT:     Head: Normocephalic and atraumatic.     Right Ear: Tympanic membrane, ear canal and external ear normal.     Left Ear: Tympanic membrane, ear canal and external ear normal.     Nose: Congestion and rhinorrhea present.  Eyes:     General: No scleral icterus.       Right eye: No discharge.        Left eye: No discharge.     Extraocular Movements: Extraocular movements intact.     Conjunctiva/sclera: Conjunctivae normal.     Pupils: Pupils are equal, round, and reactive to light.  Neck:     Thyroid: No thyromegaly.  Cardiovascular:     Rate and Rhythm: Normal rate and regular rhythm.     Pulses:  Normal pulses.     Heart sounds: Normal heart sounds. No murmur heard. Pulmonary:     Effort: Pulmonary effort is normal. No respiratory distress.     Breath sounds: Normal breath sounds. No wheezing, rhonchi or rales.  Musculoskeletal:     Cervical back: Neck supple.     Right lower leg: No edema.     Left lower leg: No edema.  Lymphadenopathy:     Cervical: No cervical adenopathy.  Skin:    General: Skin is warm and dry.     Findings: No rash.  Neurological:     Mental Status: She is alert and oriented to  person, place, and time. Mental status is at baseline.  Psychiatric:        Mood and Affect: Mood normal.        Behavior: Behavior normal.      No results found for any visits on 04/14/22.  Assessment & Plan    1. Establishing care with new doctor, encounter for Last was seen in 2022 by Fenton Malling.  2. 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  Dispense: 18 g; Refill: 0 - Ambulatory referral to Allergy for PFTs and evaluation Will follow-up  3. Seasonal allergic rhinitis due to pollen Chronic and stable Advised to use nasal saline rinse Flonase over-the-counter, Claritin-D OTC Patient requested allergy testing - Ambulatory referral to Allergy Will follow-up  4. Birth control refill Doing well on Ortho-Cyclen.  No side effects Requested refill Refill refill was sent to her pharmacy: - norgestimate-ethinyl estradiol (ORTHO-CYCLEN) 0.25-35 MG-MCG tablet; Take 1 tablet by mouth daily.  Dispense: 28 tablet; Refill: 2 Benefits and risks for taking medication were discussed. Patient agreed and expressed her understanding.   5. 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  Patient expressed understanding  6. 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  I discussed the assessment and  treatment plan with the patient. The patient was provided an opportunity to ask questions and all were answered. The patient agreed with the plan and demonstrated an understanding of the instructions.  The entirety of the information documented in the History of Present Illness, Review of Systems and Physical Exam were personally obtained by me. Portions of this information were initially documented by the CMA and reviewed by me for thoroughness and accuracy.    Erin Hudson, [facility], [place] [phone] (phone) [phone] (fax)

## 2022-04-14 ENCOUNTER — Encounter: Payer: Self-pay | Admitting: Physician Assistant

## 2022-04-14 ENCOUNTER — Ambulatory Visit (INDEPENDENT_AMBULATORY_CARE_PROVIDER_SITE_OTHER): Payer: Self-pay | Admitting: Physician Assistant

## 2022-04-14 VITALS — BP 146/85 | HR 75 | Ht 71.0 in | Wt 182.0 lb

## 2022-04-14 DIAGNOSIS — Z91018 Allergy to other foods: Secondary | ICD-10-CM

## 2022-04-14 DIAGNOSIS — Z30019 Encounter for initial prescription of contraceptives, unspecified: Secondary | ICD-10-CM

## 2022-04-14 DIAGNOSIS — J4521 Mild intermittent asthma with (acute) exacerbation: Secondary | ICD-10-CM

## 2022-04-14 DIAGNOSIS — Z7689 Persons encountering health services in other specified circumstances: Secondary | ICD-10-CM | POA: Insufficient documentation

## 2022-04-14 DIAGNOSIS — J301 Allergic rhinitis due to pollen: Secondary | ICD-10-CM

## 2022-04-14 DIAGNOSIS — Z23 Encounter for immunization: Secondary | ICD-10-CM

## 2022-04-14 MED ORDER — EPINEPHRINE 0.3 MG/0.3ML IJ SOAJ: 0.3000 mg | INTRAMUSCULAR | 0 refills | Status: DC | PRN

## 2022-04-14 MED ORDER — ALBUTEROL SULFATE HFA 108 (90 BASE) MCG/ACT IN AERS: 1.0000 | INHALATION_SPRAY | Freq: Four times a day (QID) | RESPIRATORY_TRACT | 0 refills | Status: DC | PRN

## 2022-04-14 MED ORDER — NORGESTIMATE-ETH ESTRADIOL 0.25-35 MG-MCG PO TABS: 1.0000 | ORAL_TABLET | Freq: Every day | ORAL | 2 refills | Status: DC

## 2022-05-17 ENCOUNTER — Other Ambulatory Visit: Payer: Self-pay | Admitting: Physician Assistant

## 2022-05-17 DIAGNOSIS — J4521 Mild intermittent asthma with (acute) exacerbation: Secondary | ICD-10-CM

## 2022-05-18 NOTE — Telephone Encounter (Signed)
Requested Prescriptions  Pending Prescriptions Disp Refills   albuterol (VENTOLIN HFA) 108 (90 Base) MCG/ACT inhaler [Pharmacy Med Name: ALBUTEROL HFA (VENTOLIN) INH] 18 each 0    Sig: INHALE 1-2 PUFFS BY MOUTH EVERY 6 HOURS AS NEEDED FOR WHEEZE OR SHORTNESS OF BREATH     Pulmonology:  Beta Agonists 2 Failed - 05/17/2022  2:56 AM      Failed - Last BP in normal range    BP Readings from Last 1 Encounters:  04/14/22 (!) 146/85         Passed - Last Heart Rate in normal range    Pulse Readings from Last 1 Encounters:  04/14/22 75         Passed - Valid encounter within last 12 months    Recent Outpatient Visits           1 month ago Establishing care with new doctor, encounter for   [facility] Snyder, [place], PA-C   1 year ago Back strain, initial encounter   [place], NP   2 years ago Asthmatic bronchitis without complication, unspecified asthma severity, unspecified whether persistent   [place], [place], [place]   4 years ago Acute cystitis without hematuria   [facility] Fenton Malling M, [place]   6 years ago Cough   [facility] Birdie Sons, MD       Future Appointments             In 1 month [place], Letitia Libra, PA-C [place], [facility]

## 2022-05-31 ENCOUNTER — Telehealth: Payer: Self-pay | Admitting: Physician Assistant

## 2022-05-31 DIAGNOSIS — J4521 Mild intermittent asthma with (acute) exacerbation: Secondary | ICD-10-CM

## 2022-05-31 MED ORDER — ALBUTEROL SULFATE (2.5 MG/3ML) 0.083% IN NEBU: INHALATION_SOLUTION | RESPIRATORY_TRACT | 0 refills | Status: DC

## 2022-05-31 MED ORDER — PREDNISONE 20 MG PO TABS: 40.0000 mg | ORAL_TABLET | Freq: Every day | ORAL | 0 refills | Status: DC

## 2022-05-31 MED ORDER — BENZONATATE 100 MG PO CAPS: 100.0000 mg | ORAL_CAPSULE | Freq: Three times a day (TID) | ORAL | 0 refills | Status: DC | PRN

## 2022-05-31 NOTE — Patient Instructions (Signed)
  Erin Hudson, thank you for joining Leeanne Rio, PA-C for today's virtual visit.  While this provider is not your primary care provider (PCP), if your PCP is located in our provider database this encounter information will be shared with them immediately following your visit.   [facility] account gives you access to today's visit and all your visits, tests, and labs performed at [facility] " click here if you don't have a [facility] account or go to [email or web address]  Consent: (Patient) Erin Hudson provided verbal consent for this virtual visit at the beginning of the encounter.  Current Medications:  Current Outpatient Medications:    albuterol (PROVENTIL) (2.5 MG/3ML) 0.083% nebulizer solution, USE 1 VIAL BY NEBULIZATION EVERY 6 HOURS AS NEEDED FOR WHEEZING OR SHORTNES OF BREATH, Disp: 75 mL, Rfl: 0   albuterol (VENTOLIN HFA) 108 (90 Base) MCG/ACT inhaler, INHALE 1-2 PUFFS BY MOUTH EVERY 6 HOURS AS NEEDED FOR WHEEZE OR SHORTNESS OF BREATH, Disp: 18 each, Rfl: 0   EPINEPHrine 0.3 mg/0.3 mL IJ SOAJ injection, Inject 0.3 mg into the muscle as needed for anaphylaxis., Disp: 1 each, Rfl: 0   norgestimate-ethinyl estradiol (ORTHO-CYCLEN) 0.25-35 MG-MCG tablet, Take 1 tablet by mouth daily., Disp: 28 tablet, Rfl: 2   predniSONE (DELTASONE) 20 MG tablet, Take 2 tablets (40 mg total) by mouth daily with breakfast., Disp: 10 tablet, Rfl: 0   promethazine-dextromethorphan (PROMETHAZINE-DM) 6.25-15 MG/5ML syrup, Take 5 mLs by mouth 4 (four) times daily as needed for cough., Disp: 118 mL, Rfl: 0   Medications ordered in this encounter:  No orders of the defined types were placed in this encounter.    *If you need refills on other medications prior to your next appointment, please contact your pharmacy*  Follow-Up: Call back or seek an in-person evaluation if the symptoms worsen or if the condition fails to improve as anticipated.  [place] [phone]  Other Instructions Please keep hydrated. Ok to continue use of your albuterol. Take the Tessalon as directed. You can use with OTC Delsym or can switch to Mucinex-DM with it, if you note any congestion developing.  Take the prednisone as directed.  Schedule follow-up with your PCP for further evaluation and management of your asthma.    If you have been instructed to have an in-person evaluation today at a local Urgent Care facility, please use the link below. It will take you to a list of all of our available [facility], including address, phone number and hours of operation. Please do not delay care.  [facility]  If you or a family member do not have a primary care provider, use the link below to schedule a visit and establish care. When you choose a [facility] primary care physician or advanced practice provider, you gain a long-term partner in health. Find a Primary Care Provider  Learn more about [facility] in-office and virtual care options: [facility]

## 2022-05-31 NOTE — Progress Notes (Signed)
Virtual Visit Consent   GUILLERMINA STANKE, you are scheduled for a virtual visit with a [facility] provider today. Just as with appointments in the office, your consent must be obtained to participate. Your consent will be active for this visit and any virtual visit you may have with one of our providers in the next 365 days. If you have a MyChart account, a copy of this consent can be sent to you electronically.  As this is a virtual visit, video technology does not allow for your provider to perform a traditional examination. This may limit your provider's ability to fully assess your condition. If your provider identifies any concerns that need to be evaluated in person or the need to arrange testing (such as labs, EKG, etc.), we will make arrangements to do so. Although advances in technology are sophisticated, we cannot ensure that it will always work on either your end or our end. If the connection with a video visit is poor, the visit may have to be switched to a telephone visit. With either a video or telephone visit, we are not always able to ensure that we have a secure connection.  By engaging in this virtual visit, you consent to the provision of healthcare and authorize for your insurance to be billed (if applicable) for the services provided during this visit. Depending on your insurance coverage, you may receive a charge related to this service.  I need to obtain your verbal consent now. Are you willing to proceed with your visit today? KHODI QUITO has provided verbal consent on 05/31/2022 for a virtual visit (video or telephone). Leeanne Rio, [place]  Date: 05/31/2022 1:29 PM  Virtual Visit via Video Note   I, Leeanne Rio, connected with  ZONNIE VANDENBOSSCHE  ([id number], 1985/05/21) on 05/31/22 at  1:30 PM EST by a video-enabled telemedicine application and verified that I am speaking with the correct person using two identifiers.  Location: Patient: Virtual Visit Location Patient:  Home Provider: Virtual Visit Location Provider: Home Office   I discussed the limitations of evaluation and management by telemedicine and the availability of in person appointments. The patient expressed understanding and agreed to proceed.    History of Present Illness: Erin Hudson is a 37 y.o. who identifies as a female who was assigned female at birth, and is being seen today for flare of her asthma starting Friday night into Saturday with the weather change. Noted tightness and wheezing. Started breathing treatments over the weekend which was initially helping. Over the past day with more substantial cough. Notes cough is dry but persistent and severe. Marland Kitchen  HPI: HPI  Problems:  Patient Active Problem List   Diagnosis Date Noted   Establishing care with new doctor, encounter for 04/14/2022   Allergic rhinitis 06/02/2015   AB (asthmatic bronchitis) 06/02/2015   Contraception management 06/02/2015   Dysmenorrhea 06/02/2015   Irregular menstrual cycle 06/02/2015   Vitamin D deficiency 11/27/2008   Acne 07/24/2008   Pityriasis rosea 07/24/2008   CD (contact dermatitis) 07/16/2008    Allergies:  Allergies  Allergen Reactions   Azithromycin Nausea Only and Other (See Comments)    Abdominal pain, dizziness   Medications:  Current Outpatient Medications:    albuterol (PROVENTIL) (2.5 MG/3ML) 0.083% nebulizer solution, USE 1 VIAL BY NEBULIZATION EVERY 6 HOURS AS NEEDED FOR WHEEZING OR SHORTNES OF BREATH, Disp: 75 mL, Rfl: 0   albuterol (VENTOLIN HFA) 108 (90 Base) MCG/ACT inhaler, INHALE 1-2 PUFFS BY  MOUTH EVERY 6 HOURS AS NEEDED FOR WHEEZE OR SHORTNESS OF BREATH, Disp: 18 each, Rfl: 0   benzonatate (TESSALON) 100 MG capsule, Take 1 capsule (100 mg total) by mouth 3 (three) times daily as needed for cough., Disp: 30 capsule, Rfl: 0   EPINEPHrine 0.3 mg/0.3 mL IJ SOAJ injection, Inject 0.3 mg into the muscle as needed for anaphylaxis., Disp: 1 each, Rfl: 0   norgestimate-ethinyl estradiol  (ORTHO-CYCLEN) 0.25-35 MG-MCG tablet, Take 1 tablet by mouth daily., Disp: 28 tablet, Rfl: 2   predniSONE (DELTASONE) 20 MG tablet, Take 2 tablets (40 mg total) by mouth daily with breakfast., Disp: 10 tablet, Rfl: 0  Observations/Objective: Patient is well-developed, well-nourished in no acute distress.  Resting comfortably at home.  Head is normocephalic, atraumatic.  No labored breathing. Speech is clear and coherent with logical content.  Patient is alert and oriented at baseline.   Assessment and Plan: 1. Mild intermittent asthma with exacerbation - benzonatate (TESSALON) 100 MG capsule; Take 1 capsule (100 mg total) by mouth 3 (three) times daily as needed for cough.  Dispense: 30 capsule; Refill: 0 - albuterol (PROVENTIL) (2.5 MG/3ML) 0.083% nebulizer solution; USE 1 VIAL BY NEBULIZATION EVERY 6 HOURS AS NEEDED FOR WHEEZING OR SHORTNES OF BREATH  Dispense: 75 mL; Refill: 0 - predniSONE (DELTASONE) 20 MG tablet; Take 2 tablets (40 mg total) by mouth daily with breakfast.  Dispense: 10 tablet; Refill: 0  Albuterol neb solution refilled. Supportive measures and OTC medications reviewed. Tessalon and prednisone per orders. Needs PCP follow-up as discussed.   Follow Up Instructions: I discussed the assessment and treatment plan with the patient. The patient was provided an opportunity to ask questions and all were answered. The patient agreed with the plan and demonstrated an understanding of the instructions.  A copy of instructions were sent to the patient via MyChart unless otherwise noted below.   The patient was advised to call back or seek an in-person evaluation if the symptoms worsen or if the condition fails to improve as anticipated.  Time:  I spent 10 minutes with the patient via telehealth technology discussing the above problems/concerns.    Leeanne Rio, PA-C

## 2022-06-18 ENCOUNTER — Other Ambulatory Visit: Payer: Self-pay | Admitting: Physician Assistant

## 2022-06-18 DIAGNOSIS — J4521 Mild intermittent asthma with (acute) exacerbation: Secondary | ICD-10-CM

## 2022-06-20 ENCOUNTER — Encounter: Payer: Self-pay | Admitting: Physician Assistant

## 2022-06-20 NOTE — Telephone Encounter (Signed)
Requested Prescriptions  Pending Prescriptions Disp Refills   albuterol (VENTOLIN HFA) 108 (90 Base) MCG/ACT inhaler [Pharmacy Med Name: ALBUTEROL HFA (VENTOLIN) INH] 18 each 0    Sig: INHALE 1-2 PUFFS BY MOUTH EVERY 6 HOURS AS NEEDED FOR WHEEZE OR SHORTNESS OF BREATH     Pulmonology:  Beta Agonists 2 Failed - 06/18/2022  1:08 AM      Failed - Last BP in normal range    BP Readings from Last 1 Encounters:  04/14/22 (!) 146/85         Passed - Last Heart Rate in normal range    Pulse Readings from Last 1 Encounters:  04/14/22 75         Passed - Valid encounter within last 12 months    Recent Outpatient Visits           2 months ago Establishing care with new doctor, encounter for   [facility] [place], [place], PA-C   1 year ago Back strain, initial encounter   [place], NP   2 years ago Asthmatic bronchitis without complication, unspecified asthma severity, unspecified whether persistent   [place], [place], [place]   4 years ago Acute cystitis without hematuria   [facility] [place] M[place] M, [place]   6 years ago Cough   [facility] Birdie Sons, MD       Future Appointments             In 4 days [name], PA-C [place], [facility]

## 2022-06-24 ENCOUNTER — Encounter: Payer: Self-pay | Admitting: Physician Assistant

## 2022-06-27 ENCOUNTER — Ambulatory Visit (INDEPENDENT_AMBULATORY_CARE_PROVIDER_SITE_OTHER): Payer: Self-pay | Admitting: Physician Assistant

## 2022-06-27 VITALS — BP 140/82 | HR 90 | Temp 98.8°F | Ht 71.0 in | Wt 182.0 lb

## 2022-06-27 DIAGNOSIS — Z124 Encounter for screening for malignant neoplasm of cervix: Secondary | ICD-10-CM

## 2022-06-27 DIAGNOSIS — Z113 Encounter for screening for infections with a predominantly sexual mode of transmission: Secondary | ICD-10-CM

## 2022-06-27 DIAGNOSIS — Z0000 Encounter for general adult medical examination without abnormal findings: Secondary | ICD-10-CM

## 2022-06-27 DIAGNOSIS — Z30019 Encounter for initial prescription of contraceptives, unspecified: Secondary | ICD-10-CM

## 2022-06-27 DIAGNOSIS — J4520 Mild intermittent asthma, uncomplicated: Secondary | ICD-10-CM

## 2022-06-27 MED ORDER — ALBUTEROL SULFATE (2.5 MG/3ML) 0.083% IN NEBU: INHALATION_SOLUTION | RESPIRATORY_TRACT | 1 refills | Status: DC

## 2022-06-27 MED ORDER — NORGESTIMATE-ETH ESTRADIOL 0.25-35 MG-MCG PO TABS: 1.0000 | ORAL_TABLET | Freq: Every day | ORAL | 11 refills | Status: DC

## 2022-06-27 NOTE — Progress Notes (Unsigned)
[place],acting as a Education administrator for [employer], PA-C.,have documented all relevant documentation on the behalf of [name], PA-C,as directed by  [employer], PA-C while in the presence of [employer], PA-C.    Complete physical exam   Patient: Erin Hudson   DOB: 01-27-1986   37 y.o. Female  MRN: [id number] Visit Date: 06/27/2022  Today's healthcare provider: [name], PA-C  CC: CPE wih pap smear  Subjective    Erin Hudson is a 37 y.o. female who presents today for a complete physical exam.  She reports consuming a general diet. Home exercise routine includes going to the gym. She generally feels well. She reports sleeping well. She does not have additional problems to discuss today.   Patient states she is spotting today and would like to have BCP refill  Discuss with patient when appropriate time to start breast exam. Would like to Rx for nebulizer Her asthma is stable   Past Medical History:  Diagnosis Date   Asthma    Past Surgical History:  Procedure Laterality Date   HERNIA REPAIR     INDUCED ABORTION  2022   pyloric stenosis     TONSILLECTOMY     Social History   Socioeconomic History   Marital status: Single    Spouse name: Not on file   Number of children: 0   Years of education: Not on file   Highest education level: Not on file  Occupational History   Not on file  Tobacco Use   Smoking status: Never   Smokeless tobacco: Never  Vaping Use   Vaping Use: Never used  Substance and Sexual Activity   Alcohol use: Yes    Comment: OCC   Drug use: No   Sexual activity: Yes    Birth control/protection: Pill  Other Topics Concern   Not on file  Social History Narrative   Not on file   Social Determinants of Health   Financial Resource Strain: Not on file  Food Insecurity: Not on file  Transportation Needs: Not on file  Physical Activity: Not on file  Stress: Not on file  Social Connections: Not on file  Intimate Partner  Violence: Not on file   Family Status  Relation Name Status   Mother  Alive   Father  Alive   Sister  Alive   Family History  Problem Relation Age of Onset   Heart disease Mother    Hypertension Mother    Heart disease Father    Depression Father    Colon polyps Father    Healthy Sister    Allergies  Allergen Reactions   Azithromycin Nausea Only and Other (See Comments)    Abdominal pain, dizziness    Patient Care Team: [name], PA-C as PCP - General (Physician Assistant)   Medications: Outpatient Medications Prior to Visit  Medication Sig   albuterol (VENTOLIN HFA) 108 (90 Base) MCG/ACT inhaler INHALE 1-2 PUFFS BY MOUTH EVERY 6 HOURS AS NEEDED FOR WHEEZE OR SHORTNESS OF BREATH   benzonatate (TESSALON) 100 MG capsule Take 1 capsule (100 mg total) by mouth 3 (three) times daily as needed for cough.   EPINEPHrine 0.3 mg/0.3 mL IJ SOAJ injection Inject 0.3 mg into the muscle as needed for anaphylaxis.   predniSONE (DELTASONE) 20 MG tablet Take 2 tablets (40 mg total) by mouth daily with breakfast.   [DISCONTINUED] albuterol (PROVENTIL) (2.5 MG/3ML) 0.083% nebulizer solution USE 1 VIAL BY NEBULIZATION EVERY 6 HOURS AS  NEEDED FOR WHEEZING OR SHORTNES OF BREATH   [DISCONTINUED] norgestimate-ethinyl estradiol (ORTHO-CYCLEN) 0.25-35 MG-MCG tablet Take 1 tablet by mouth daily.   No facility-administered medications prior to visit.    Review of Systems  Constitutional: Negative.   HENT: Negative.    Eyes: Negative.   Respiratory: Negative.    Cardiovascular: Negative.   Gastrointestinal: Negative.   Endocrine: Negative.   Genitourinary: Negative.   Musculoskeletal: Negative.   Skin: Negative.   Allergic/Immunologic: Negative.   Neurological: Negative.   Hematological: Negative.   Psychiatric/Behavioral: Negative.        Objective    BP (!) 140/82 (BP Location: Left Arm, Patient Position: Sitting, Cuff Size: Normal)   Pulse 90   Temp 98.8 F (37.1 C) (Oral)    Ht 5\' 11"  (1.803 m)   Wt 182 lb (82.6 kg)   SpO2 99%   BMI 25.38 kg/m     Physical Exam Vitals reviewed.  Constitutional:      General: She is not in acute distress.    Appearance: Normal appearance. She is well-developed. She is not diaphoretic.  HENT:     Head: Normocephalic and atraumatic.     Right Ear: Tympanic membrane, ear canal and external ear normal.     Left Ear: Tympanic membrane, ear canal and external ear normal.     Nose: Nose normal.     Mouth/Throat:     Mouth: Mucous membranes are moist.     Pharynx: Oropharynx is clear. No oropharyngeal exudate.  Eyes:     General: No scleral icterus.    Conjunctiva/sclera: Conjunctivae normal.     Pupils: Pupils are equal, round, and reactive to light.  Neck:     Thyroid: No thyromegaly.  Cardiovascular:     Rate and Rhythm: Normal rate and regular rhythm.     Pulses: Normal pulses.     Heart sounds: Normal heart sounds. No murmur heard. Pulmonary:     Effort: Pulmonary effort is normal. No respiratory distress.     Breath sounds: Normal breath sounds. No wheezing or rales.  Chest:     Chest wall: No mass, lacerations, deformity, swelling, tenderness, crepitus or edema. There is no dullness to percussion.  Breasts:    Tanner Score is 5.     Breasts are symmetrical.     Right: Normal. No swelling, bleeding, inverted nipple, mass, nipple discharge, skin change or tenderness.     Left: Normal. No swelling, bleeding, inverted nipple, mass, nipple discharge, skin change or tenderness.  Abdominal:     General: There is no distension.     Palpations: Abdomen is soft.     Tenderness: There is no abdominal tenderness.     Hernia: There is no hernia in the left inguinal area or right inguinal area.  Genitourinary:    General: Normal vulva.     Exam position: Lithotomy position.     Pubic Area: No rash or pubic lice.      Tanner stage (genital): 5.     Labia:        Right: No rash, tenderness, lesion or injury.         Left: No rash, tenderness, lesion or injury.      Urethra: No prolapse, urethral pain, urethral swelling or urethral lesion.     Comments: A slight vaginal discharge   Cervix:  Cervix pink, tender to swab , speculum. No cervical motion tenderness, lesion, cervical bleeding or eversion.    Uterus: Normal.  Adnexa: Right adnexa normal and left adnexa normal.   Musculoskeletal:        General: No deformity.     Cervical back: Neck supple.     Right lower leg: No edema.     Left lower leg: No edema.  Lymphadenopathy:     Cervical: No cervical adenopathy.     Upper Body:     Right upper body: No supraclavicular, axillary or pectoral adenopathy.     Left upper body: No supraclavicular, axillary or pectoral adenopathy.     Lower Body: No right inguinal adenopathy. No left inguinal adenopathy.  Skin:    General: Skin is warm and dry.     Findings: No rash.  Neurological:     Mental Status: She is alert and oriented to person, place, and time. Mental status is at baseline.     Sensory: No sensory deficit.     Motor: No weakness.     Gait: Gait normal.  Psychiatric:        Mood and Affect: Mood normal.        Behavior: Behavior normal.        Thought Content: Thought content normal.      Last depression screening scores    06/27/2022    1:11 PM 04/14/2022    9:06 AM  PHQ 2/9 Scores  PHQ - 2 Score 0 0  PHQ- 9 Score 0 1   Last fall risk screening    06/27/2022    1:11 PM  [place] in the past year? 0  Number falls in past yr: 0  Injury with Fall? 0   Last Audit-C alcohol use screening    06/27/2022    1:11 PM  Alcohol Use Disorder Test (AUDIT)  1. How often do you have a drink containing alcohol? 2  2. How many drinks containing alcohol do you have on a typical day when you are drinking? 0  3. How often do you have six or more drinks on one occasion? 0  AUDIT-C Score 2   A score of 3 or more in women, and 4 or more in men indicates increased risk for alcohol  abuse, EXCEPT if all of the points are from question 1   No results found for any visits on 06/27/22.  Assessment & Plan    Routine Health Maintenance and Physical Exam  Exercise Activities and Dietary recommendations  Goals   None     Immunization History  Administered Date(s) Administered   HPV Quadrivalent 11/20/2008, 01/22/2009, 09/08/2009   Influenza Split 01/22/2009, 03/14/2012   Influenza,inj,Quad PF,6+ Mos 04/14/2022   Meningococcal Conjugate 11/20/2008   Tdap 11/20/2008    Health Maintenance  Topic Date Due   COVID-19 Vaccine (1) Never done   PAP SMEAR-Modifier  Never done   Hepatitis C Screening  04/15/2023 (Originally 11/27/2005)   HIV Screening  04/15/2023 (Originally 11/28/2002)   INFLUENZA VACCINE  10/27/2022   HPV VACCINES  Completed   DTaP/Tdap/Td  Discontinued    Discussed health benefits of physical activity, and encouraged her to engage in regular exercise appropriate for her age and condition.  Screening for cervical cancer Last pap smear was done "ages ago...." - Cervicovaginal ancillary only -Ordered PAP smear  Vaginal discharge On PE noted Ordered BV test as she has a hx of BV in the past Will reassess after  receiving lab results   Annual physical exam UTD on dental/eye Things to do to keep yourself healthy  -  Exercise at least 30-45 minutes a day, 3-4 days a week.  - Eat a low-fat diet with lots of fruits and vegetables, up to 7-9 servings per day.  - Seatbelts can save your life. Wear them always.  - Smoke detectors on every level of your home, check batteries every year.  - Eye Doctor - have an eye exam every 1-2 years  - Safe sex - if you may be exposed to STDs, use a condom.  - Alcohol -  If you drink, do it moderately, less than 2 drinks per day.  - [place]. Choose someone to speak for you if you are not able.  - Depression is common in our stressful world.If you're feeling down or losing interest in things you  normally enjoy, please come in for a visit.  - Violence - If anyone is threatening or hurting you, please call immediately.    Mild intermittent asthmatic bronchitis  Chronic and stable Normal lung PE Continue her current regimen with albuterol inhaler  Called in albuterol nebulizer. Per pt, she has been on nebulizer for many years since she was diagnosed as a child, however, advised to schedule an appt with Allergy for re- assessment and management Will need to check with her allergist for a refill  Courtesy refill will be provided for a mo Last refill was done on 05/31/22  Encounter for Birth control Doing well on her current BCP/Ortho-Cyclen 0.25-35 mg-mcg tablets Spotting was present in December and now On PE there were no spotting but vaginal discharge Refill was sent for a year  Pt is self-paid.  No follow-ups on file.    The patient was advised to call back or seek an in-person evaluation if the symptoms worsen or if the condition fails to improve as anticipated.  I discussed the assessment and treatment plan with the patient. The patient was provided an opportunity to ask questions and all were answered. The patient agreed with the plan and demonstrated an understanding of the instructions.  I, [name], PA-C have reviewed all documentation for this visit. The documentation on 06/27/22 for the exam, diagnosis, procedures, and orders are all accurate and complete.  [name], [facility], [place] [phone] (phone) [phone] (fax)   [place]

## 2022-06-28 ENCOUNTER — Other Ambulatory Visit: Payer: Self-pay

## 2022-06-28 ENCOUNTER — Other Ambulatory Visit (HOSPITAL_COMMUNITY)
Admission: RE | Admit: 2022-06-28 | Discharge: 2022-06-28 | Disposition: A | Discharge status: Home / Self Care | Payer: Self-pay | Source: Ambulatory Visit | Attending: Physician Assistant | Admitting: Physician Assistant

## 2022-06-28 DIAGNOSIS — Z124 Encounter for screening for malignant neoplasm of cervix: Secondary | ICD-10-CM

## 2022-06-28 DIAGNOSIS — Z0000 Encounter for general adult medical examination without abnormal findings: Secondary | ICD-10-CM

## 2022-06-28 DIAGNOSIS — Z113 Encounter for screening for infections with a predominantly sexual mode of transmission: Secondary | ICD-10-CM | POA: Insufficient documentation

## 2022-06-29 ENCOUNTER — Telehealth: Payer: Self-pay | Admitting: Physician Assistant

## 2022-06-29 ENCOUNTER — Encounter: Payer: Self-pay | Admitting: Physician Assistant

## 2022-06-29 DIAGNOSIS — N760 Acute vaginitis: Secondary | ICD-10-CM

## 2022-06-29 DIAGNOSIS — Z124 Encounter for screening for malignant neoplasm of cervix: Secondary | ICD-10-CM | POA: Insufficient documentation

## 2022-06-29 DIAGNOSIS — Z0000 Encounter for general adult medical examination without abnormal findings: Secondary | ICD-10-CM | POA: Insufficient documentation

## 2022-06-29 LAB — CERVICOVAGINAL ANCILLARY ONLY
Bacterial Vaginitis (gardnerella): POSITIVE — AB
Comment: NEGATIVE

## 2022-06-29 MED ORDER — METRONIDAZOLE 500 MG PO TABS: 500.0000 mg | ORAL_TABLET | Freq: Two times a day (BID) | ORAL | 0 refills | Status: AC

## 2022-06-29 NOTE — Telephone Encounter (Signed)
Patient advised. Verbalized understanding

## 2022-06-29 NOTE — Telephone Encounter (Signed)
Please, let pt know that her test was positive for bacterial vaginosis. Med was called in. Advised to add yogurt Cipriano Bunker to her daily diet. Results for pap smear are pending

## 2022-07-04 LAB — CYTOLOGY - PAP
Comment: NEGATIVE
High risk HPV: POSITIVE — AB

## 2022-07-04 NOTE — Progress Notes (Signed)
Please, let pt know that her pap smear showed low grade squamous intraepithelial lesion/a presence of slightly abnormal cells but negative HPV. Recommended to repeat pap smear in a year. If she has any questions, please, contact me via mychart or phone.

## 2022-08-12 ENCOUNTER — Telehealth: Payer: Self-pay | Admitting: Physician Assistant

## 2022-08-12 NOTE — Telephone Encounter (Signed)
[facility]  albuterol (VENTOLIN HFA) 108 (90 Base) MCG/ACT inhaler Please advise 

## 2022-08-14 ENCOUNTER — Other Ambulatory Visit: Payer: Self-pay | Admitting: Physician Assistant

## 2022-08-14 DIAGNOSIS — J4521 Mild intermittent asthma with (acute) exacerbation: Secondary | ICD-10-CM

## 2022-08-15 ENCOUNTER — Other Ambulatory Visit: Payer: Self-pay

## 2022-08-15 NOTE — Telephone Encounter (Signed)
Requested Prescriptions  Pending Prescriptions Disp Refills   albuterol (VENTOLIN HFA) 108 (90 Base) MCG/ACT inhaler [Pharmacy Med Name: ALBUTEROL HFA (VENTOLIN) INH] 18 each 0    Sig: INHALE 1-2 PUFFS BY MOUTH EVERY 6 HOURS AS NEEDED FOR WHEEZE OR SHORTNESS OF BREATH     Pulmonology:  Beta Agonists 2 Failed - 08/14/2022  1:08 AM      Failed - Last BP in normal range    BP Readings from Last 1 Encounters:  06/27/22 (!) 140/82         Passed - Last Heart Rate in normal range    Pulse Readings from Last 1 Encounters:  06/27/22 90         Passed - Valid encounter within last 12 months    Recent Outpatient Visits           1 month ago Annual physical exam   [place] [facility] [place], [place], PA-C   4 months ago Establishing care with new doctor, encounter for   [facility] [place], [place], PA-C   1 year ago Back strain, initial encounter   [place] [facility] [place], Lauren A, NP   2 years ago Asthmatic bronchitis without complication, unspecified asthma severity, unspecified whether persistent   [facility] [place], [place], [place]   4 years ago Acute cystitis without hematuria   [facility] [place], [place], [place]

## 2022-12-07 ENCOUNTER — Other Ambulatory Visit: Payer: Self-pay | Admitting: Physician Assistant

## 2022-12-07 DIAGNOSIS — J4520 Mild intermittent asthma, uncomplicated: Secondary | ICD-10-CM

## 2022-12-07 DIAGNOSIS — J4521 Mild intermittent asthma with (acute) exacerbation: Secondary | ICD-10-CM

## 2022-12-08 ENCOUNTER — Encounter: Payer: Self-pay | Admitting: Physician Assistant

## 2022-12-09 ENCOUNTER — Other Ambulatory Visit: Payer: Self-pay | Admitting: Physician Assistant

## 2022-12-09 DIAGNOSIS — J4520 Mild intermittent asthma, uncomplicated: Secondary | ICD-10-CM

## 2022-12-09 NOTE — Telephone Encounter (Signed)
Medication Refill - Medication: albuterol (VENTOLIN HFA) 108 (90 Base) MCG/ACT inhaler   Pt is completely out of her current supply, she wants to have refills on this so she does not have to call again.   Has the patient contacted their pharmacy? Yes.   (Agent: If no, request that the patient contact the pharmacy for the refill. If patient does not wish to contact the pharmacy document the reason why and proceed with request.) (Agent: If yes, when and what did the pharmacy advise?)  Preferred Pharmacy (with phone number or street name):  [id number] [place], [place] - [address]  [address] [place] [address]  Phone: [phone] Fax: [phone]   Has the patient been seen for an appointment in the last year OR does the patient have an upcoming appointment? Yes.    Agent: Please be advised that RX refills may take up to 3 business days. We ask that you follow-up with your pharmacy.

## 2022-12-12 ENCOUNTER — Other Ambulatory Visit: Payer: Self-pay | Admitting: Physician Assistant

## 2022-12-12 DIAGNOSIS — J4520 Mild intermittent asthma, uncomplicated: Secondary | ICD-10-CM

## 2022-12-12 MED ORDER — ALBUTEROL SULFATE HFA 108 (90 BASE) MCG/ACT IN AERS: 2.0000 | INHALATION_SPRAY | Freq: Four times a day (QID) | RESPIRATORY_TRACT | 1 refills | Status: DC | PRN

## 2022-12-12 MED ORDER — ALBUTEROL SULFATE (2.5 MG/3ML) 0.083% IN NEBU: INHALATION_SOLUTION | RESPIRATORY_TRACT | 1 refills | Status: DC

## 2022-12-12 NOTE — Telephone Encounter (Signed)
Requested medications are due for refill today.  unsure  Requested medications are on the active medications list.  yes  Last refill. unsure  Future visit scheduled.   no  Notes to clinic.  See note    Requested Prescriptions  Pending Prescriptions Disp Refills   albuterol (PROVENTIL) (2.5 MG/3ML) 0.083% nebulizer solution 75 mL 1    Sig: USE 1 VIAL BY NEBULIZATION EVERY 6 HOURS AS NEEDED FOR WHEEZING OR SHORTNES OF BREATH     Pulmonology:  Beta Agonists 2 Failed - 12/09/2022  1:42 PM      Failed - Last BP in normal range    BP Readings from Last 1 Encounters:  06/27/22 (!) 140/82         Passed - Last Heart Rate in normal range    Pulse Readings from Last 1 Encounters:  06/27/22 90         Passed - Valid encounter within last 12 months    Recent Outpatient Visits           5 months ago Annual physical exam   [place] [facility] [place], [place], PA-C   8 months ago Establishing care with new doctor, encounter for   [facility] [place], [place], PA-C   2 years ago Back strain, initial encounter   [place] [facility] [place], Lauren A, NP   2 years ago Asthmatic bronchitis without complication, unspecified asthma severity, unspecified whether persistent   [facility] [place], [place], [place]   4 years ago Acute cystitis without hematuria   [facility] [place], [place], [place]

## 2022-12-12 NOTE — Telephone Encounter (Signed)
Called pt  - call went to voice mail, mailbox full unable to LM. Need to clarify which medication is needed. Request from pt was for inhaler. Inhaler was refilled today.  Medication chosen form med list for refill was nebulizer vials.

## 2022-12-12 NOTE — Telephone Encounter (Signed)
Please, see Allergy for evaluation.

## 2023-03-10 ENCOUNTER — Other Ambulatory Visit: Payer: Self-pay | Admitting: Physician Assistant

## 2023-03-10 DIAGNOSIS — J4520 Mild intermittent asthma, uncomplicated: Secondary | ICD-10-CM

## 2023-03-10 NOTE — Telephone Encounter (Signed)
Requested Prescriptions  Pending Prescriptions Disp Refills   albuterol (VENTOLIN HFA) 108 (90 Base) MCG/ACT inhaler [Pharmacy Med Name: ALBUTEROL HFA (PROAIR) INHALER] 8.5 each 1    Sig: TAKE 2 PUFFS BY MOUTH EVERY 6 HOURS AS NEEDED FOR WHEEZE OR SHORTNESS OF BREATH     Pulmonology:  Beta Agonists 2 Failed - 03/10/2023  8:29 AM      Failed - Last BP in normal range    BP Readings from Last 1 Encounters:  06/27/22 (!) 140/82         Passed - Last Heart Rate in normal range    Pulse Readings from Last 1 Encounters:  06/27/22 90         Passed - Valid encounter within last 12 months    Recent Outpatient Visits           8 months ago Annual physical exam   [place] [facility] [place], [place], PA-C   11 months ago Establishing care with new doctor, encounter for   [facility] [place], [place], PA-C   2 years ago Back strain, initial encounter   [place] [facility] [place], Lauren A, NP   2 years ago Asthmatic bronchitis without complication, unspecified asthma severity, unspecified whether persistent   [facility] [place], [place], [place]   5 years ago Acute cystitis without hematuria   [facility] [place], [place], [place]

## 2023-06-09 ENCOUNTER — Ambulatory Visit: Payer: Self-pay | Admitting: Physician Assistant

## 2023-06-11 NOTE — Progress Notes (Unsigned)
 Established patient visit  Patient: Erin Hudson   DOB: [birth year]   38 y.o. Female  MRN: [id number] Visit Date: 06/13/2023  Today's healthcare provider: Debera Lat, PA-C   No chief complaint on file.  Subjective       Discussed the use of AI scribe software for clinical note transcription with the patient, who gave verbal consent to proceed.  History of Present Illness               06/27/2022    1:11 PM 04/14/2022    9:06 AM  Depression screen PHQ 2/9  Decreased Interest 0 0  Down, Depressed, Hopeless 0 0  PHQ - 2 Score 0 0  Altered sleeping 0 0  Tired, decreased energy 0 1  Change in appetite 0 0  Feeling bad or failure about yourself  0 0  Trouble concentrating 0 0  Moving slowly or fidgety/restless 0 0  Suicidal thoughts 0 0  PHQ-9 Score 0 1  Difficult doing work/chores Not difficult at all Not difficult at all       No data to display          Medications: Outpatient Medications Prior to Visit  Medication Sig  . albuterol (PROVENTIL) (2.5 MG/3ML) 0.083% nebulizer solution USE 1 VIAL BY NEBULIZATION EVERY 6 HOURS AS NEEDED FOR WHEEZING OR SHORTNES OF BREATH  . albuterol (VENTOLIN HFA) 108 (90 Base) MCG/ACT inhaler TAKE 2 PUFFS BY MOUTH EVERY 6 HOURS AS NEEDED FOR WHEEZE OR SHORTNESS OF BREATH  . benzonatate (TESSALON) 100 MG capsule Take 1 capsule (100 mg total) by mouth 3 (three) times daily as needed for cough.  . EPINEPHrine 0.3 mg/0.3 mL IJ SOAJ injection Inject 0.3 mg into the muscle as needed for anaphylaxis.  [place] norgestimate-ethinyl estradiol (ORTHO-CYCLEN) 0.25-35 MG-MCG tablet Take 1 tablet by mouth daily.  . predniSONE (DELTASONE) 20 MG tablet Take 2 tablets (40 mg total) by mouth daily with breakfast.   No facility-administered medications prior to visit.    Review of Systems  All other systems reviewed and are negative. All negative Except see HPI   {Insert previous labs (optional):23779} {See past labs  Heme  Chem  Endocrine   Serology  Results Review (optional):1}   Objective    There were no vitals taken for this visit. {Insert last BP/Wt (optional):23777}{See vitals history (optional):1}   Physical Exam Vitals reviewed.  Constitutional:      General: She is not in acute distress.    Appearance: Normal appearance. She is well-developed. She is not diaphoretic.  HENT:     Head: Normocephalic and atraumatic.  Eyes:     General: No scleral icterus.    Conjunctiva/sclera: Conjunctivae normal.  Neck:     Thyroid: No thyromegaly.  Cardiovascular:     Rate and Rhythm: Normal rate and regular rhythm.     Pulses: Normal pulses.     Heart sounds: Normal heart sounds. No murmur heard. Pulmonary:     Effort: Pulmonary effort is normal. No respiratory distress.     Breath sounds: Normal breath sounds. No wheezing, rhonchi or rales.  Musculoskeletal:     Cervical back: Neck supple.     Right lower leg: No edema.     Left lower leg: No edema.  Lymphadenopathy:     Cervical: No cervical adenopathy.  Skin:    General: Skin is warm and dry.     Findings: No rash.  Neurological:     Mental Status: She is alert and oriented  to person, place, and time. Mental status is at baseline.  Psychiatric:        Mood and Affect: Mood normal.        Behavior: Behavior normal.     No results found for any visits on 06/13/23.      Assessment and Plan             No orders of the defined types were placed in this encounter.   No follow-ups on file.   The patient was advised to call back or seek an in-person evaluation if the symptoms worsen or if the condition fails to improve as anticipated.  I discussed the assessment and treatment plan with the patient. The patient was provided an opportunity to ask questions and all were answered. The patient agreed with the plan and demonstrated an understanding of the instructions.  I, Debera Lat, PA-C have reviewed all documentation for this visit. The documentation  on 06/13/2023  for the exam, diagnosis, procedures, and orders are all accurate and complete.  Debera Lat, [facility], MMS [facility] [phone] (phone) [phone] (fax)  [facility]

## 2023-06-13 ENCOUNTER — Encounter: Payer: Self-pay | Admitting: Physician Assistant

## 2023-06-13 ENCOUNTER — Ambulatory Visit (INDEPENDENT_AMBULATORY_CARE_PROVIDER_SITE_OTHER): Payer: Self-pay | Admitting: Physician Assistant

## 2023-06-13 VITALS — BP 139/85 | HR 74 | Temp 97.2°F | Resp 16 | Ht 70.0 in | Wt 204.0 lb

## 2023-06-13 DIAGNOSIS — J4521 Mild intermittent asthma with (acute) exacerbation: Secondary | ICD-10-CM

## 2023-06-13 DIAGNOSIS — J4520 Mild intermittent asthma, uncomplicated: Secondary | ICD-10-CM

## 2023-06-13 DIAGNOSIS — R519 Headache, unspecified: Secondary | ICD-10-CM

## 2023-06-13 DIAGNOSIS — G8929 Other chronic pain: Secondary | ICD-10-CM

## 2023-06-13 DIAGNOSIS — Z30019 Encounter for initial prescription of contraceptives, unspecified: Secondary | ICD-10-CM

## 2023-06-13 DIAGNOSIS — E663 Overweight: Secondary | ICD-10-CM

## 2023-06-13 DIAGNOSIS — R030 Elevated blood-pressure reading, without diagnosis of hypertension: Secondary | ICD-10-CM

## 2023-06-13 DIAGNOSIS — N926 Irregular menstruation, unspecified: Secondary | ICD-10-CM

## 2023-06-13 MED ORDER — CELECOXIB 200 MG PO CAPS: 200.0000 mg | ORAL_CAPSULE | Freq: Two times a day (BID) | ORAL | 1 refills | Status: DC

## 2023-06-13 MED ORDER — ALBUTEROL SULFATE HFA 108 (90 BASE) MCG/ACT IN AERS: 1.0000 | INHALATION_SPRAY | RESPIRATORY_TRACT | 1 refills | Status: DC | PRN

## 2023-06-13 MED ORDER — NORGESTIMATE-ETH ESTRADIOL 0.25-35 MG-MCG PO TABS: 1.0000 | ORAL_TABLET | Freq: Every day | ORAL | 11 refills | Status: AC

## 2023-06-20 ENCOUNTER — Ambulatory Visit: Payer: Self-pay | Admitting: Family Medicine

## 2023-07-24 ENCOUNTER — Ambulatory Visit: Payer: Self-pay | Admitting: Physician Assistant

## 2023-08-17 ENCOUNTER — Other Ambulatory Visit: Payer: Self-pay

## 2023-08-17 ENCOUNTER — Inpatient Hospital Stay (HOSPITAL_COMMUNITY)
Admission: EM | Admit: 2023-08-17 | Discharge: 2023-08-22 | DRG: 558 | Disposition: A | Discharge status: Home / Self Care | Payer: Self-pay | Attending: Internal Medicine | Admitting: Internal Medicine

## 2023-08-17 DIAGNOSIS — J4520 Mild intermittent asthma, uncomplicated: Secondary | ICD-10-CM | POA: Diagnosis present

## 2023-08-17 DIAGNOSIS — T796XXA Traumatic ischemia of muscle, initial encounter: Principal | ICD-10-CM

## 2023-08-17 DIAGNOSIS — R7401 Elevation of levels of liver transaminase levels: Secondary | ICD-10-CM

## 2023-08-17 DIAGNOSIS — E876 Hypokalemia: Secondary | ICD-10-CM | POA: Diagnosis present

## 2023-08-17 DIAGNOSIS — R030 Elevated blood-pressure reading, without diagnosis of hypertension: Secondary | ICD-10-CM | POA: Diagnosis present

## 2023-08-17 DIAGNOSIS — D72829 Elevated white blood cell count, unspecified: Secondary | ICD-10-CM | POA: Diagnosis present

## 2023-08-17 DIAGNOSIS — R9431 Abnormal electrocardiogram [ECG] [EKG]: Secondary | ICD-10-CM | POA: Diagnosis present

## 2023-08-17 DIAGNOSIS — J45909 Unspecified asthma, uncomplicated: Secondary | ICD-10-CM | POA: Insufficient documentation

## 2023-08-17 DIAGNOSIS — M6282 Rhabdomyolysis: Principal | ICD-10-CM | POA: Diagnosis present

## 2023-08-17 LAB — CBC
HCT: 45.2 % (ref 36.0–46.0)
Hemoglobin: 15.1 g/dL — ABNORMAL HIGH (ref 12.0–15.0)
MCH: 28.2 pg (ref 26.0–34.0)
MCHC: 33.4 g/dL (ref 30.0–36.0)
MCV: 84.3 fL (ref 80.0–100.0)
Platelets: 244 10*3/uL (ref 150–400)
RBC: 5.36 MIL/uL — ABNORMAL HIGH (ref 3.87–5.11)
RDW: 12.7 % (ref 11.5–15.5)
WBC: 12.2 10*3/uL — ABNORMAL HIGH (ref 4.0–10.5)
nRBC: 0 % (ref 0.0–0.2)

## 2023-08-17 LAB — COMPREHENSIVE METABOLIC PANEL WITH GFR
ALT: 146 U/L — ABNORMAL HIGH (ref 0–44)
AST: 509 U/L — ABNORMAL HIGH (ref 15–41)
Albumin: 3.5 g/dL (ref 3.5–5.0)
Alkaline Phosphatase: 42 U/L (ref 38–126)
Anion gap: 10 (ref 5–15)
BUN: 15 mg/dL (ref 6–20)
CO2: 22 mmol/L (ref 22–32)
Calcium: 9.3 mg/dL (ref 8.9–10.3)
Chloride: 108 mmol/L (ref 98–111)
Creatinine, Ser: 1.02 mg/dL — ABNORMAL HIGH (ref 0.44–1.00)
GFR, Estimated: >60 mL/min (ref >60)
Glucose, Bld: 104 mg/dL — ABNORMAL HIGH (ref 70–99)
Potassium: 4 mmol/L (ref 3.5–5.1)
Sodium: 140 mmol/L (ref 135–145)
Total Bilirubin: 0.6 mg/dL (ref 0.0–1.2)
Total Protein: 6.3 g/dL — ABNORMAL LOW (ref 6.5–8.1)

## 2023-08-17 LAB — URINALYSIS, ROUTINE W REFLEX MICROSCOPIC
Bacteria, UA: NONE SEEN
Bilirubin Urine: NEGATIVE
Glucose, UA: NEGATIVE mg/dL
Ketones, ur: NEGATIVE mg/dL
Leukocytes,Ua: NEGATIVE
Nitrite: NEGATIVE
Protein, ur: 30 mg/dL — AB
RBC / HPF: >50 RBC/hpf (ref 0–5)
Specific Gravity, Urine: 1.027 (ref 1.005–1.030)
pH: 5 (ref 5.0–8.0)

## 2023-08-17 LAB — PREGNANCY, URINE: Preg Test, Ur: NEGATIVE

## 2023-08-17 MED ORDER — SODIUM BICARBONATE 8.4 % IV SOLN
INTRAVENOUS | Status: DC
  Filled 2023-08-17 (×5): qty 1000
  Filled 2023-08-17: qty 150
  Filled 2023-08-17 (×5): qty 1000
  Filled 2023-08-17: qty 150
  Filled 2023-08-17 (×4): qty 1000

## 2023-08-17 MED ORDER — SODIUM BICARBONATE 8.4 % IV SOLN: INTRAVENOUS | Status: DC

## 2023-08-17 MED ORDER — SODIUM CHLORIDE 0.9 % IV BOLUS
1000.0000 mL | Freq: Once | INTRAVENOUS | Status: AC
  Administered 2023-08-17: 1000 mL via INTRAVENOUS

## 2023-08-17 NOTE — ED Triage Notes (Signed)
 Patient reports bilateral arms swelling today , denies injury or fall , patient stated she worked out last Tuesday .

## 2023-08-17 NOTE — ED Provider Notes (Addendum)
 [facility] AT [facility] Provider Note   CSN: [id number] Arrival date & time: 08/17/23  1948     History  Chief Complaint  Patient presents with   Arms Swelling    Erin Hudson is a 38 y.o. female.  38 year old female presents emergency room with complaint of swelling to her upper extremities which she first noticed this morning upon waking.  Reports that her arms feel a little sore, not significantly painful.  She did her usual arm workout on Tuesday (2 days ago).  Denies changes in urine, weakness, numbness.  Family has noted that she has an area/raised welt to her left posterior upper arm which they first noticed while sitting in the hall bed today.  She denies any itching, is not allergic to anything.  No rash noted to body otherwise.       Home Medications Prior to Admission medications   Medication Sig Start Date End Date Taking? Authorizing Provider  acetaminophen  (TYLENOL ) 500 MG tablet Take 1,000 mg by mouth every 6 (six) hours as needed for mild pain (pain score 1-3) or headache.   Yes [provider]  albuterol  (PROVENTIL ) (2.5 MG/3ML) 0.083% nebulizer solution USE 1 VIAL BY NEBULIZATION EVERY 6 HOURS AS NEEDED FOR WHEEZING OR SHORTNES OF BREATH 12/12/22  Yes Ostwalt, Janna, PA-C  albuterol  (VENTOLIN  HFA) 108 (90 Base) MCG/ACT inhaler Inhale 1-2 puffs into the lungs every 4 (four) hours as needed for wheezing or shortness of breath. Patient taking differently: Inhale 2 puffs into the lungs every 4 (four) hours as needed for wheezing or shortness of breath. 06/13/23  Yes Ostwalt, Janna, PA-C  norgestimate -ethinyl estradiol  (ORTHO-CYCLEN) 0.25-35 MG-MCG tablet Take 1 tablet by mouth daily. 06/13/23  Yes Ostwalt, Janna, PA-C  EPINEPHrine  0.3 mg/0.3 mL IJ SOAJ injection Inject 0.3 mg into the muscle as needed for anaphylaxis. Patient not taking: Reported on 08/18/2023 04/14/22   Ostwalt, Janna, PA-C      Allergies    Azithromycin      Review of Systems   Review of Systems Negative except as per HPI Physical Exam Updated Vital Signs BP 136/78   Pulse 75   Temp 98.1 F (36.7 C)   Resp 20   SpO2 100%  Physical Exam Vitals and nursing note reviewed.  Constitutional:      General: She is not in acute distress.    Appearance: She is well-developed. She is not diaphoretic.  HENT:     Head: Normocephalic and atraumatic.  Cardiovascular:     Pulses: Normal pulses.  Pulmonary:     Effort: Pulmonary effort is normal.  Musculoskeletal:        General: Swelling present. No tenderness or deformity.     Comments: Swelling to upper extremities (upper arms and forearms), raised/blanched area noted to posterior left upper arm/triceps area  Skin:    General: Skin is warm and dry.  Neurological:     Mental Status: She is alert and oriented to person, place, and time.     Sensory: No sensory deficit.     Motor: No weakness.  Psychiatric:        Behavior: Behavior normal.     ED Results / Procedures / Treatments   Labs (all labs ordered are listed, but only abnormal results are displayed) Labs Reviewed  CBC - Abnormal; Notable for the following components:      Result Value   WBC 12.2 (*)    RBC 5.36 (*)    Hemoglobin 15.1 (*)  All other components within normal limits  COMPREHENSIVE METABOLIC PANEL WITH GFR - Abnormal; Notable for the following components:   Glucose, Bld 104 (*)    Creatinine, Ser 1.02 (*)    Total Protein 6.3 (*)    AST 509 (*)    ALT 146 (*)    All other components within normal limits  CK - Abnormal; Notable for the following components:   Total CK >20,000 (*)    All other components within normal limits  URINALYSIS, ROUTINE W REFLEX MICROSCOPIC - Abnormal; Notable for the following components:   APPearance HAZY (*)    Hgb urine dipstick LARGE (*)    Protein, ur 30 (*)    All other components within normal limits  PREGNANCY, URINE  URINALYSIS, DIPSTICK ONLY  CBC  COMPREHENSIVE  METABOLIC PANEL WITH GFR  CK  I-STAT CREATININE, ED    EKG None  Radiology No results found.  Procedures .Critical Care  Performed by: Darlis Eisenmenger, PA-C Authorized by: Darlis Eisenmenger, PA-C   Critical care provider statement:    Critical care time (minutes):  75   Critical care was time spent personally by me on the following activities:  Development of treatment plan with patient or surrogate, discussions with consultants, evaluation of patient's response to treatment, examination of patient, ordering and review of laboratory studies, ordering and review of radiographic studies, ordering and performing treatments and interventions, pulse oximetry, re-evaluation of patient's condition and review of old charts     Medications Ordered in ED Medications  sodium bicarbonate 150 mEq in dextrose 5 % 1,150 mL infusion ( Intravenous New Bag/Given 08/18/23 0055)  sodium chloride 0.9 % bolus 1,000 mL (0 mLs Intravenous Stopped 08/18/23 0057)    ED Course/ Medical Decision Making/ A&P                                 Medical Decision Making Amount and/or Complexity of Data Reviewed Labs: ordered.  Risk Decision regarding hospitalization.   This patient presents to the ED for concern of upper extremity swelling, this involves an extensive number of treatment options, and is a complaint that carries with it a high risk of complications and morbidity.  The differential diagnosis includes rhabdomyolysis, angioedema   Co morbidities that complicate the patient evaluation  asthma   Additional history obtained:  Additional history obtained from mom at bedside External records from outside source obtained and reviewed including review of prior well visit dated 06/13/23, BP in clinic 139/85   Lab Tests:  I Ordered, and personally interpreted labs.  The pertinent results include:  initial CK could not be measured by lab, after multiple dilutions, resulted at >20k. CBC with mild  leukocytosis as 12.2. CMP with elevated AST at 509 and ALT 146 with Cr 1.02. UA with large hgb and 30 protein, >50 RBC. Hcg negative   Cardiac Monitoring: / EKG:  The patient was maintained on a cardiac monitor.  I personally viewed and interpreted the cardiac monitored which showed an underlying rhythm of: sinus rhythm, rate 80   Problem List / ED Course / Critical interventions / Medication management  38 year old female presents with complaint of swelling in her arms. Notes arm work out 2 days, states this was her usual work out but this morning woke up with swelling. Does not report significant pain, denies tingling in her arms/hands, arms do not feel cool to the touch, radial  pulses are strong. Labs consistent with rhabdomyolysis, discussed with ER attending, IV fluids ordered. Discussed with trauma MD who recommends admit to hosptialist. Discussed with hospitalist who reports it will take several hours until she can see the patient. Foley was ordered to monitor urine output. Patient declined foley, RN staff to measure urine OP as pt uses the bathroom.  While awaiting admission, patient reports swelling in her right hand. Patient reexamined, has mild swelling to hand, exam otherwise unchanged. Discussed monitoring for compartment syndrome with patient who verbalizes understanding, does not appear consistent with compartment syndrome at this time.  I ordered medication including IVF (D5W with bicarb) for urine alkalinization   Reevaluation of the patient after these medicines showed that the patient stayed the same I have reviewed the patients home medicines and have made adjustments as needed   Consultations Obtained:  I requested consultation with the ER attending, Dr. Maralee Senate,  and discussed lab and imaging findings as well as pertinent plan - they recommend: consult to trauma for admission for traumatic rhabdomyolysis, IVF D5W + 3 amps bicarb, foley to monitor urine output and pH.  Case  discussed with Dr. Elvan Hamel, on call for trauma surgery, recommends hospitalist admission.  Case discussed with Dr. Michell Ahumada with [facility] service who will consult for admission.    Social Determinants of Health:  Lives at home   Test / Admission - Considered:  Admit          Final Clinical Impression(s) / ED Diagnoses Final diagnoses:  Traumatic rhabdomyolysis, initial encounter [facility])    Rx / DC Orders ED Discharge Orders     None         Darlis Eisenmenger, PA-C 08/18/23 0532    Darlis Eisenmenger, PA-C 08/18/23 Babara Lerner, April, MD 08/18/23 [phone]

## 2023-08-17 NOTE — ED Provider Triage Note (Signed)
 Emergency Medicine Provider Triage Evaluation Note  Erin Hudson , a 38 y.o. female  was evaluated in triage.  Pt complains of swollen arms. States she did an intense work-up on Tuesday and has noticed persistent swelling in her bilateral arms since then. States it started with her upper arms and moved to her lower arms as well. Denies any pain or weakness. No dark colored urine. No fevers or chills. No swelling anywhere else.   Review of Systems  Positive:  Negative:   Physical Exam  BP (!) 170/120 (BP Location: Right Arm)   Pulse 65   Temp 97.7 F (36.5 C)   Resp 15   SpO2 98%  Gen:   Awake, no distress   Resp:  Normal effort  MSK:   Moves extremities without difficulty  Other:  BUE with swelling throughout. No erythema or warmth. No pain. Radial pulses intact bilaterally. 5/5 strength  Medical Decision Making  Medically screening exam initiated at 7:56 PM.  Appropriate orders placed.  Erin Hudson was informed that the remainder of the evaluation will be completed by another provider, this initial triage assessment does not replace that evaluation, and the importance of remaining in the ED until their evaluation is complete.     Sherra Dk, PA-C 08/17/23 2000

## 2023-08-18 ENCOUNTER — Observation Stay (HOSPITAL_BASED_OUTPATIENT_CLINIC_OR_DEPARTMENT_OTHER): Payer: Self-pay

## 2023-08-18 ENCOUNTER — Encounter (HOSPITAL_COMMUNITY): Payer: Self-pay | Admitting: Internal Medicine

## 2023-08-18 DIAGNOSIS — R7401 Elevation of levels of liver transaminase levels: Secondary | ICD-10-CM

## 2023-08-18 DIAGNOSIS — M6282 Rhabdomyolysis: Principal | ICD-10-CM

## 2023-08-18 DIAGNOSIS — M7989 Other specified soft tissue disorders: Secondary | ICD-10-CM

## 2023-08-18 LAB — COMPREHENSIVE METABOLIC PANEL WITH GFR
ALT: 128 U/L — ABNORMAL HIGH (ref 0–44)
ALT: 146 U/L — ABNORMAL HIGH (ref 0–44)
ALT: 136 U/L — ABNORMAL HIGH (ref 0–44)
AST: 377 U/L — ABNORMAL HIGH (ref 15–41)
AST: 422 U/L — ABNORMAL HIGH (ref 15–41)
AST: 388 U/L — ABNORMAL HIGH (ref 15–41)
Albumin: 2.8 g/dL — ABNORMAL LOW (ref 3.5–5.0)
Albumin: 3.2 g/dL — ABNORMAL LOW (ref 3.5–5.0)
Albumin: 3 g/dL — ABNORMAL LOW (ref 3.5–5.0)
Alkaline Phosphatase: 35 U/L — ABNORMAL LOW (ref 38–126)
Alkaline Phosphatase: 37 U/L — ABNORMAL LOW (ref 38–126)
Alkaline Phosphatase: 36 U/L — ABNORMAL LOW (ref 38–126)
Anion gap: 5 (ref 5–15)
Anion gap: 9 (ref 5–15)
Anion gap: 11 (ref 5–15)
BUN: 9 mg/dL (ref 6–20)
BUN: 5 mg/dL — ABNORMAL LOW (ref 6–20)
BUN: 5 mg/dL — ABNORMAL LOW (ref 6–20)
CO2: 25 mmol/L (ref 22–32)
CO2: 25 mmol/L (ref 22–32)
CO2: 23 mmol/L (ref 22–32)
Calcium: 8.3 mg/dL — ABNORMAL LOW (ref 8.9–10.3)
Calcium: 8.6 mg/dL — ABNORMAL LOW (ref 8.9–10.3)
Calcium: 8.5 mg/dL — ABNORMAL LOW (ref 8.9–10.3)
Chloride: 107 mmol/L (ref 98–111)
Chloride: 106 mmol/L (ref 98–111)
Chloride: 105 mmol/L (ref 98–111)
Creatinine, Ser: 0.85 mg/dL (ref 0.44–1.00)
Creatinine, Ser: 0.81 mg/dL (ref 0.44–1.00)
Creatinine, Ser: 0.78 mg/dL (ref 0.44–1.00)
GFR, Estimated: >60 mL/min (ref >60)
GFR, Estimated: >60 mL/min (ref >60)
GFR, Estimated: >60 mL/min (ref >60)
Glucose, Bld: 105 mg/dL — ABNORMAL HIGH (ref 70–99)
Glucose, Bld: 89 mg/dL (ref 70–99)
Glucose, Bld: 111 mg/dL — ABNORMAL HIGH (ref 70–99)
Potassium: 3.4 mmol/L — ABNORMAL LOW (ref 3.5–5.1)
Potassium: 3.5 mmol/L (ref 3.5–5.1)
Potassium: 3.6 mmol/L (ref 3.5–5.1)
Sodium: 137 mmol/L (ref 135–145)
Sodium: 140 mmol/L (ref 135–145)
Sodium: 139 mmol/L (ref 135–145)
Total Bilirubin: 0.6 mg/dL (ref 0.0–1.2)
Total Bilirubin: 0.5 mg/dL (ref 0.0–1.2)
Total Bilirubin: 0.6 mg/dL (ref 0.0–1.2)
Total Protein: 5.3 g/dL — ABNORMAL LOW (ref 6.5–8.1)
Total Protein: 6 g/dL — ABNORMAL LOW (ref 6.5–8.1)
Total Protein: 5.4 g/dL — ABNORMAL LOW (ref 6.5–8.1)

## 2023-08-18 LAB — BLOOD GAS, VENOUS
Acid-Base Excess: 2.5 mmol/L — ABNORMAL HIGH (ref 0.0–2.0)
Acid-Base Excess: 7.5 mmol/L — ABNORMAL HIGH (ref 0.0–2.0)
Acid-Base Excess: 6.6 mmol/L — ABNORMAL HIGH (ref 0.0–2.0)
Bicarbonate: 27.9 mmol/L (ref 20.0–28.0)
Bicarbonate: 32.7 mmol/L — ABNORMAL HIGH (ref 20.0–28.0)
Bicarbonate: 31.3 mmol/L — ABNORMAL HIGH (ref 20.0–28.0)
Drawn by: 8360
O2 Saturation: 62.8 %
O2 Saturation: 77.8 %
O2 Saturation: 82.1 %
Patient temperature: 37
Patient temperature: 36.9
Patient temperature: 37
pCO2, Ven: 45 mmHg (ref 44–60)
pCO2, Ven: 47 mmHg (ref 44–60)
pCO2, Ven: 44 mmHg (ref 44–60)
pH, Ven: 7.4 (ref 7.25–7.43)
pH, Ven: 7.45 — ABNORMAL HIGH (ref 7.25–7.43)
pH, Ven: 7.46 — ABNORMAL HIGH (ref 7.25–7.43)
pO2, Ven: 37 mmHg (ref 32–45)
pO2, Ven: 46 mmHg — ABNORMAL HIGH (ref 32–45)
pO2, Ven: 46 mmHg — ABNORMAL HIGH (ref 32–45)

## 2023-08-18 LAB — CBC
HCT: 40.4 % (ref 36.0–46.0)
Hemoglobin: 13.6 g/dL (ref 12.0–15.0)
MCH: 27.9 pg (ref 26.0–34.0)
MCHC: 33.7 g/dL (ref 30.0–36.0)
MCV: 83 fL (ref 80.0–100.0)
Platelets: 199 10*3/uL (ref 150–400)
RBC: 4.87 MIL/uL (ref 3.87–5.11)
RDW: 12.5 % (ref 11.5–15.5)
WBC: 8.3 10*3/uL (ref 4.0–10.5)
nRBC: 0 % (ref 0.0–0.2)

## 2023-08-18 LAB — I-STAT CREATININE, ED: Creatinine, Ser: 0.9 mg/dL (ref 0.44–1.00)

## 2023-08-18 LAB — TROPONIN I (HIGH SENSITIVITY): Troponin I (High Sensitivity): 7 ng/L (ref <18)

## 2023-08-18 LAB — HIV ANTIBODY (ROUTINE TESTING W REFLEX): HIV Screen 4th Generation wRfx: NONREACTIVE

## 2023-08-18 LAB — PHOSPHORUS
Phosphorus: 3.2 mg/dL (ref 2.5–4.6)
Phosphorus: 3 mg/dL (ref 2.5–4.6)
Phosphorus: 3.2 mg/dL (ref 2.5–4.6)

## 2023-08-18 LAB — CK
Total CK: >20000 U/L — ABNORMAL HIGH (ref 38–234)
Total CK: >20000 U/L — ABNORMAL HIGH (ref 38–234)
Total CK: >20000 U/L — ABNORMAL HIGH (ref 38–234)
Total CK: >20000 U/L — ABNORMAL HIGH (ref 38–234)

## 2023-08-18 MED ORDER — ALBUTEROL SULFATE (2.5 MG/3ML) 0.083% IN NEBU: 2.5000 mg | INHALATION_SOLUTION | Freq: Four times a day (QID) | RESPIRATORY_TRACT | Status: DC | PRN

## 2023-08-18 MED ORDER — ALBUTEROL SULFATE (2.5 MG/3ML) 0.083% IN NEBU: 3.0000 mL | INHALATION_SOLUTION | RESPIRATORY_TRACT | Status: DC | PRN

## 2023-08-18 MED ORDER — ACETAMINOPHEN 325 MG PO TABS
650.0000 mg | ORAL_TABLET | Freq: Four times a day (QID) | ORAL | Status: DC | PRN
  Administered 2023-08-18 – 2023-08-19 (×2): 650 mg via ORAL
  Filled 2023-08-18 (×2): qty 2

## 2023-08-18 NOTE — ED Notes (Signed)
 Pt educated on reason for MD wanting foley placed. Pt able to repeat back education, but refused foley placement stating that "I am able to walk and know when I have to pee, I just peed in a cup". Pt educated that strict I&Os will need to be measured. Anytime she urinates, she will need to urinate in a measuring hat so it can be measured appropriately. Pt pleasant and agreeable to plan of care. No questions voiced from pt at this time. Pt on monitor. Visitor at bedside and call light within reach.

## 2023-08-18 NOTE — H&P (Signed)
 History and Physical    Erin Hudson ZOX:[id number] DOB: [birth year] DOA: 08/17/2023  PCP: Ostwalt, Janna, PA-C  Patient coming from: Home  Chief Complaint: Bilateral upper extremity swelling  HPI: Erin Hudson is a 38 y.o. female with medical history significant of mild intermittent asthma presents to the ED with bilateral upper extremity swelling in the setting of upper extremity weightlifting workout 3 days ago.  Patient states the day after her workout, she started having soreness of her bilateral upper extremities and noticed swelling.  She was lifting 25 pound weights.  She does not remember how many sets or repetitions of lifting she did.  She normally lifts weights 3-4 times a week and does not think that this workout was any more intense than usual.  She is reporting some mild soreness of the muscles of her bilateral arms and forearms.  Denies any upper extremity numbness or tingling.  Denies any change in the color of her urine.  She does not take statins.  Denies any previous history of swelling or rhabdomyolysis.  No other complaints.  No fevers, cough, shortness of breath, chest pain, nausea, vomiting, or abdominal pain.  ED Course: Hypertensive on arrival but remainder vital signs stable.  Blood pressure subsequently improved without administration of any antihypertensive agents.  Labs notable for WBC count 12.2, hemoglobin 15.1, potassium 4.0, creatinine 1.0, calcium 9.3, albumin 3.5, AST 509, ALT 146, alk phos and T. bili normal, CK >20,000, UA consistent with myoglobinuria.  EKG showing sinus rhythm and mild T wave inversions inferolaterally.  No previous EKG for comparison. Patient was given 1 L normal saline and started on sodium bicarb infusion at 200 mL/h.  Review of Systems:  Review of Systems  All other systems reviewed and are negative.   Past Medical History:  Diagnosis Date   Asthma     Past Surgical History:  Procedure Laterality Date   HERNIA REPAIR     INDUCED  ABORTION  2022   pyloric stenosis     TONSILLECTOMY       reports that she has never smoked. She has never used smokeless tobacco. She reports current alcohol use. She reports that she does not use drugs.  Allergies  Allergen Reactions   Azithromycin  Nausea Only and Other (See Comments)    Abdominal pain, dizziness    Family History  Problem Relation Age of Onset   Heart disease Mother    Hypertension Mother    Heart disease Father    Depression Father    Colon polyps Father    Healthy Sister     Prior to Admission medications   Medication Sig Start Date End Date Taking? Authorizing Provider  acetaminophen  (TYLENOL ) 500 MG tablet Take 1,000 mg by mouth every 6 (six) hours as needed for mild pain (pain score 1-3) or headache.   Yes [provider]  albuterol  (PROVENTIL ) (2.5 MG/3ML) 0.083% nebulizer solution USE 1 VIAL BY NEBULIZATION EVERY 6 HOURS AS NEEDED FOR WHEEZING OR SHORTNES OF BREATH 12/12/22  Yes Ostwalt, Janna, PA-C  albuterol  (VENTOLIN  HFA) 108 (90 Base) MCG/ACT inhaler Inhale 1-2 puffs into the lungs every 4 (four) hours as needed for wheezing or shortness of breath. Patient taking differently: Inhale 2 puffs into the lungs every 4 (four) hours as needed for wheezing or shortness of breath. 06/13/23  Yes Ostwalt, Janna, PA-C  norgestimate -ethinyl estradiol  (ORTHO-CYCLEN) 0.25-35 MG-MCG tablet Take 1 tablet by mouth daily. 06/13/23  Yes Ostwalt, Janna, PA-C  EPINEPHrine  0.3 mg/0.3 mL IJ  SOAJ injection Inject 0.3 mg into the muscle as needed for anaphylaxis. Patient not taking: Reported on 08/18/2023 04/14/22   Blane Bunting, PA-C    Physical Exam: Vitals:   08/18/23 0145 08/18/23 0200 08/18/23 0215 08/18/23 0230  BP: 138/85 131/75 138/73 136/78  Pulse: 94 79 76 75  Resp: 20 10 10 20   Temp:      SpO2: 100% 100% 100% 100%    Physical Exam Vitals reviewed.  Constitutional:      General: She is not in acute distress.    Comments: Resting comfortably  HENT:      Head: Normocephalic and atraumatic.  Eyes:     Extraocular Movements: Extraocular movements intact.  Cardiovascular:     Rate and Rhythm: Normal rate and regular rhythm.     Pulses: Normal pulses.  Pulmonary:     Effort: Pulmonary effort is normal. No respiratory distress.     Breath sounds: Normal breath sounds. No wheezing or rales.  Abdominal:     General: Bowel sounds are normal. There is no distension.     Palpations: Abdomen is soft.     Tenderness: There is no abdominal tenderness. There is no guarding.  Musculoskeletal:        General: Swelling present.     Cervical back: Normal range of motion.     Comments: Swelling of bilateral arms and forearms.  Bilateral upper extremities neurovascularly intact.  Pulses strong, sensation intact.  Muscle compartments soft to touch.  Skin:    General: Skin is warm and dry.  Neurological:     General: No focal deficit present.     Mental Status: She is alert and oriented to person, place, and time.     Labs on Admission: I have personally reviewed following labs and imaging studies  CBC: Recent Labs  Lab 08/17/23 2005  WBC 12.2*  HGB 15.1*  HCT 45.2  MCV 84.3  PLT 244   Basic Metabolic Panel: Recent Labs  Lab 08/17/23 2005  NA 140  K 4.0  CL 108  CO2 22  GLUCOSE 104*  BUN 15  CREATININE 1.02*  CALCIUM 9.3   GFR: CrCl cannot be calculated (Unknown ideal weight.). Liver Function Tests: Recent Labs  Lab 08/17/23 2005  AST 509*  ALT 146*  ALKPHOS 42  BILITOT 0.6  PROT 6.3*  ALBUMIN 3.5   No results for input(s): "LIPASE", "AMYLASE" in the last 168 hours. No results for input(s): "AMMONIA" in the last 168 hours. Coagulation Profile: No results for input(s): "INR", "PROTIME" in the last 168 hours. Cardiac Enzymes: Recent Labs  Lab 08/17/23 2005  CKTOTAL >20,000*   BNP (last 3 results) No results for input(s): "PROBNP" in the last 8760 hours. HbA1C: No results for input(s): "HGBA1C" in the last 72  hours. CBG: No results for input(s): "GLUCAP" in the last 168 hours. Lipid Profile: No results for input(s): "CHOL", "HDL", "LDLCALC", "TRIG", "CHOLHDL", "LDLDIRECT" in the last 72 hours. Thyroid Function Tests: No results for input(s): "TSH", "T4TOTAL", "FREET4", "T3FREE", "THYROIDAB" in the last 72 hours. Anemia Panel: No results for input(s): "VITAMINB12", "FOLATE", "FERRITIN", "TIBC", "IRON", "RETICCTPCT" in the last 72 hours. Urine analysis:    Component Value Date/Time   COLORURINE YELLOW 08/17/2023 2300   APPEARANCEUR HAZY (A) 08/17/2023 2300   LABSPEC 1.027 08/17/2023 2300   PHURINE 5.0 08/17/2023 2300   GLUCOSEU NEGATIVE 08/17/2023 2300   HGBUR LARGE (A) 08/17/2023 2300   BILIRUBINUR NEGATIVE 08/17/2023 2300   BILIRUBINUR negative 01/01/2018 1647  KETONESUR NEGATIVE 08/17/2023 2300   PROTEINUR 30 (A) 08/17/2023 2300   UROBILINOGEN 0.2 09/21/2020 1726   NITRITE NEGATIVE 08/17/2023 2300   LEUKOCYTESUR NEGATIVE 08/17/2023 2300    Radiological Exams on Admission: No results found.  Assessment and Plan  Rhabdomyolysis Patient is presenting with bilateral upper extremity swelling and mild soreness of the arms and forearms in the setting of upper extremity weightlifting workout 3 days ago.  Neurovascularly intact and no signs of compartment syndrome.  CK >20,000, UA consistent with myoglobinuria, potassium and calcium within normal range, and no signs of acute kidney injury on labs.  Patient was given 1 L normal saline bolus and started on sodium bicarb infusion at 200 mL/h in the ED.  Continue IV fluid hydration.  Goal urine output 200-300 mL/hr while avoiding volume overload.  Monitor potassium, phosphorus, calcium, bicarb level, pH, renal function, and CK level every 4 hours.  Repeat labs ordered.  Transaminitis In the setting of rhabdomyolysis.  Patient is not on statins.  AST 509, ALT 146, alk phos and T. bili normal.  No abdominal pain/tenderness, nausea, or vomiting.   Monitor LFTs and avoid hepatotoxic agents.  EKG abnormality EKG showing mild T wave inversions inferolaterally but there is no previous tracing for comparison.  ACS less likely as patient is not endorsing any anginal symptoms and resting comfortably.  Check troponin and monitor electrolytes.  Mild leukocytosis No infectious signs or symptoms.  Trend WBC count.  Mild intermittent asthma Stable, no signs of acute exacerbation.  Albuterol  PRN.  DVT prophylaxis: SCDs Code Status: Full Code (discussed with the patient) Family Communication: Mother at bedside. Level of care: Telemetry bed Admission status: It is my clinical opinion that referral for OBSERVATION is reasonable and necessary in this patient based on the above information provided. The aforementioned taken together are felt to place the patient at high risk for further clinical deterioration. However, it is anticipated that the patient may be medically stable for discharge from the hospital within 24 to 48 hours.  Juliette Oh MD [facility]  If 7PM-7AM, please contact night-coverage [email or web address]  08/18/2023, 4:54 AM

## 2023-08-18 NOTE — Progress Notes (Signed)
 Psychosocial Progressive/Outcome: ANOx4, calm and cooperative  Family at bedside   Pain/Comfort Progression/Outcome: Pt did not report any pain during shift   Clinical Progression/Outcome: Adequate fluid and diet intake  Independent in positioning  Up ad lib  Voids to BR as needed  Slept in between care Maintained safety

## 2023-08-18 NOTE — Progress Notes (Signed)
 PROGRESS NOTE    Erin Hudson  ZOX:[id number] DOB: 1986/01/23 DOA: 08/17/2023 PCP: Ostwalt, Janna, PA-C  Outpatient Specialists:     Brief Narrative:  Admitted earlier today with rhabdomyolysis.  Renal function is normal.    Assessment & Plan:   Principal Problem:   Rhabdomyolysis Active Problems:   Asthma, chronic   Transaminitis   Rhabdomyolysis: -CPK remains greater than 20,000 -Continue to monitor renal function and CK -Check UDS -Continue normal saline at 200 cc/hour    Transaminitis In the setting of rhabdomyolysis.      Mild leukocytosis Resolved.   Mild intermittent asthma Stable.   DVT prophylaxis: SCD Code Status: Full code Family Communication: Mother at the bedside Disposition Plan: Observation   Consultants:  None  Procedures:  None  Antimicrobials:  None    Subjective: No new complaints  Objective: Vitals:   08/18/23 0200 08/18/23 0215 08/18/23 0230 08/18/23 0700  BP: 131/75 138/73 136/78 128/82  Pulse: 79 76 75 75  Resp: 10 10 20 14   Temp:      SpO2: 100% 100% 100% 100%    Intake/Output Summary (Last 24 hours) at 08/18/2023 0747 Last data filed at 08/18/2023 0559 Gross per 24 hour  Intake --  Output 200 ml  Net -200 ml   There were no vitals filed for this visit.  Examination:  General exam: Appears calm and comfortable  Respiratory system: Clear to auscultation.   Cardiovascular system: S1 & S2 heard  Gastrointestinal system: Abdomen is soft and non tender Central nervous system: Alert and oriented. Patient moves all extremities. Extremities:  Concerned about swelling of upper extremities     Data Reviewed: I have personally reviewed following labs and imaging studies  CBC: Recent Labs  Lab 08/17/23 2005 08/18/23 0548  WBC 12.2* 8.3  HGB 15.1* 13.6  HCT 45.2 40.4  MCV 84.3 83.0  PLT 244 199   Basic Metabolic Panel: Recent Labs  Lab 08/17/23 2005 08/18/23 0500 08/18/23 0548 08/18/23 0553  NA 140  --   137  --   K 4.0  --  3.4*  --   CL 108  --  107  --   CO2 22  --  25  --   GLUCOSE 104*  --  105*  --   BUN 15  --  9  --   CREATININE 1.02* 0.90 0.85  --   CALCIUM 9.3  --  8.3*  --   PHOS  --   --   --  3.2   GFR: CrCl cannot be calculated (Unknown ideal weight.). Liver Function Tests: Recent Labs  Lab 08/17/23 2005 08/18/23 0548  AST 509* 377*  ALT 146* 128*  ALKPHOS 42 35*  BILITOT 0.6 0.6  PROT 6.3* 5.3*  ALBUMIN 3.5 2.8*   No results for input(s): "LIPASE", "AMYLASE" in the last 168 hours. No results for input(s): "AMMONIA" in the last 168 hours. Coagulation Profile: No results for input(s): "INR", "PROTIME" in the last 168 hours. Cardiac Enzymes: Recent Labs  Lab 08/17/23 2005  CKTOTAL >20,000*   BNP (last 3 results) No results for input(s): "PROBNP" in the last 8760 hours. HbA1C: No results for input(s): "HGBA1C" in the last 72 hours. CBG: No results for input(s): "GLUCAP" in the last 168 hours. Lipid Profile: No results for input(s): "CHOL", "HDL", "LDLCALC", "TRIG", "CHOLHDL", "LDLDIRECT" in the last 72 hours. Thyroid Function Tests: No results for input(s): "TSH", "T4TOTAL", "FREET4", "T3FREE", "THYROIDAB" in the last 72 hours. Anemia Panel: No results  for input(s): "VITAMINB12", "FOLATE", "FERRITIN", "TIBC", "IRON", "RETICCTPCT" in the last 72 hours. Urine analysis:    Component Value Date/Time   COLORURINE YELLOW 08/17/2023 2300   APPEARANCEUR HAZY (A) 08/17/2023 2300   LABSPEC 1.027 08/17/2023 2300   PHURINE 5.0 08/17/2023 2300   GLUCOSEU NEGATIVE 08/17/2023 2300   HGBUR LARGE (A) 08/17/2023 2300   BILIRUBINUR NEGATIVE 08/17/2023 2300   BILIRUBINUR negative 01/01/2018 1647   KETONESUR NEGATIVE 08/17/2023 2300   PROTEINUR 30 (A) 08/17/2023 2300   UROBILINOGEN 0.2 09/21/2020 1726   NITRITE NEGATIVE 08/17/2023 2300   LEUKOCYTESUR NEGATIVE 08/17/2023 2300   Sepsis Labs: @LABRCNTIP (procalcitonin:4,lacticidven:4)  )No results found for this  or any previous visit (from the past 240 hours).       Radiology Studies: No results found.      Scheduled Meds: Continuous Infusions:  sodium bicarbonate 150 mEq in dextrose 5 % 1,150 mL infusion 200 mL/hr at 08/18/23 0544     LOS: 0 days    Time spent: 45 Minutes    Fonnie Iba, MD  [facility] Pager #: [phone] 7PM-7AM contact night coverage as above

## 2023-08-18 NOTE — ED Notes (Signed)
 This RN called CCMD to place pt on monitor

## 2023-08-19 DIAGNOSIS — T796XXD Traumatic ischemia of muscle, subsequent encounter: Secondary | ICD-10-CM

## 2023-08-19 LAB — URINALYSIS, DIPSTICK ONLY
Bilirubin Urine: NEGATIVE
Glucose, UA: NEGATIVE mg/dL
Ketones, ur: NEGATIVE mg/dL
Leukocytes,Ua: NEGATIVE
Nitrite: NEGATIVE
Protein, ur: 100 mg/dL — AB
Specific Gravity, Urine: 1.016 (ref 1.005–1.030)
pH: 9 — ABNORMAL HIGH (ref 5.0–8.0)

## 2023-08-19 LAB — RENAL FUNCTION PANEL
Albumin: 3.2 g/dL — ABNORMAL LOW (ref 3.5–5.0)
Anion gap: 8 (ref 5–15)
BUN: <5 mg/dL — ABNORMAL LOW (ref 6–20)
CO2: 28 mmol/L (ref 22–32)
Calcium: 8.9 mg/dL (ref 8.9–10.3)
Chloride: 105 mmol/L (ref 98–111)
Creatinine, Ser: 0.79 mg/dL (ref 0.44–1.00)
GFR, Estimated: >60 mL/min (ref >60)
Glucose, Bld: 108 mg/dL — ABNORMAL HIGH (ref 70–99)
Phosphorus: 3.4 mg/dL (ref 2.5–4.6)
Potassium: 3.7 mmol/L (ref 3.5–5.1)
Sodium: 141 mmol/L (ref 135–145)

## 2023-08-19 LAB — CK
Total CK: >20000 U/L — ABNORMAL HIGH (ref 38–234)
Total CK: >20000 U/L — ABNORMAL HIGH (ref 38–234)

## 2023-08-19 LAB — MAGNESIUM: Magnesium: 1.9 mg/dL (ref 1.7–2.4)

## 2023-08-19 LAB — RAPID URINE DRUG SCREEN, HOSP PERFORMED
Amphetamines: NOT DETECTED
Barbiturates: NOT DETECTED
Benzodiazepines: NOT DETECTED
Cocaine: NOT DETECTED
Opiates: NOT DETECTED
Tetrahydrocannabinol: NOT DETECTED

## 2023-08-19 MED ORDER — SUMATRIPTAN 20 MG/ACT NA SOLN
20.0000 mg | NASAL | Status: DC | PRN
  Administered 2023-08-19: 20 mg via NASAL
  Filled 2023-08-19: qty 1

## 2023-08-19 MED ORDER — ONDANSETRON HCL 4 MG/2ML IJ SOLN
4.0000 mg | Freq: Four times a day (QID) | INTRAMUSCULAR | Status: DC | PRN
  Administered 2023-08-19: 4 mg via INTRAVENOUS
  Filled 2023-08-19: qty 2

## 2023-08-19 NOTE — Plan of Care (Signed)

## 2023-08-19 NOTE — Progress Notes (Signed)
 PROGRESS NOTE    Erin Hudson  OZH:[id number] DOB: Feb 21, 1986 DOA: 08/17/2023 PCP: Ostwalt, Janna, PA-C  Outpatient Specialists:     Brief Narrative:  Admitted earlier today with rhabdomyolysis.  Renal function is normal.   08/19/2023: CK remains greater than 20,000.  Normal renal function.  Vascular Doppler ultrasound of bilateral upper extremities was negative for DVT.  Patient continues to report swelling of both upper extremities.  Seen alongside patient's mother and aunt, in the presence of patient's nurse.  Check CK twice daily.  Continue aggressive hydration.  Continue to monitor renal function closely.   Assessment & Plan:   Principal Problem:   Rhabdomyolysis Active Problems:   Asthma, chronic   Transaminitis   Rhabdomyolysis: -CPK remains greater than 20,000 -Continue to monitor renal function and CK -Negative UDS -Continue IVF at 200 cc/hour    Transaminitis In the setting of rhabdomyolysis.      Mild leukocytosis Resolved.   Mild intermittent asthma Stable.   DVT prophylaxis: SCD Code Status: Full code Family Communication: Mother at the bedside Disposition Plan: Observation   Consultants:  None  Procedures:  None  Antimicrobials:  None    Subjective: No new complaints  Objective: Vitals:   08/18/23 2356 08/19/23 0439 08/19/23 0901 08/19/23 1239  BP: (!) 114/59 135/89 (!) 140/84 137/76  Pulse: 80 68 (!) 57 70  Resp: 18 18 16 18   Temp: 98.3 F (36.8 C) 98.3 F (36.8 C) 98.5 F (36.9 C) 98.1 F (36.7 C)  TempSrc:      SpO2: 97% 100% 97% 98%    Intake/Output Summary (Last 24 hours) at 08/19/2023 1651 Last data filed at 08/19/2023 0006 Gross per 24 hour  Intake --  Output 200 ml  Net -200 ml   There were no vitals filed for this visit.  Examination:  General exam: Appears calm and comfortable  Respiratory system: Clear to auscultation.   Cardiovascular system: S1 & S2 heard  Gastrointestinal system: Abdomen is soft and non  tender Central nervous system: Alert and oriented. Patient moves all extremities. Extremities:  Concerned about swelling of upper extremities     Data Reviewed: I have personally reviewed following labs and imaging studies  CBC: Recent Labs  Lab 08/17/23 2005 08/18/23 0548  WBC 12.2* 8.3  HGB 15.1* 13.6  HCT 45.2 40.4  MCV 84.3 83.0  PLT 244 199   Basic Metabolic Panel: Recent Labs  Lab 08/17/23 2005 08/18/23 0500 08/18/23 0548 08/18/23 0553 08/18/23 1057 08/18/23 1320 08/19/23 0805  NA 140  --  137  --  140 139 141  K 4.0  --  3.4*  --  3.5 3.6 3.7  CL 108  --  107  --  106 105 105  CO2 22  --  25  --  25 23 28   GLUCOSE 104*  --  105*  --  89 111* 108*  BUN 15  --  9  --  5* 5* <5*  CREATININE 1.02* 0.90 0.85  --  0.81 0.78 0.79  CALCIUM 9.3  --  8.3*  --  8.6* 8.5* 8.9  MG  --   --   --   --   --   --  1.9  PHOS  --   --   --  3.2 3.0 3.2 3.4   GFR: CrCl cannot be calculated (Unknown ideal weight.). Liver Function Tests: Recent Labs  Lab 08/17/23 2005 08/18/23 0548 08/18/23 1057 08/18/23 1320 08/19/23 0805  AST 509* 377* 422*  388*  --   ALT 146* 128* 146* 136*  --   ALKPHOS 42 35* 37* 36*  --   BILITOT 0.6 0.6 0.5 0.6  --   PROT 6.3* 5.3* 6.0* 5.4*  --   ALBUMIN 3.5 2.8* 3.2* 3.0* 3.2*   No results for input(s): "LIPASE", "AMYLASE" in the last 168 hours. No results for input(s): "AMMONIA" in the last 168 hours. Coagulation Profile: No results for input(s): "INR", "PROTIME" in the last 168 hours. Cardiac Enzymes: Recent Labs  Lab 08/18/23 0548 08/18/23 1057 08/18/23 1320 08/19/23 0805 08/19/23 1307  CKTOTAL >20,000* >20,000* >20,000* >20,000* >20,000*   BNP (last 3 results) No results for input(s): "PROBNP" in the last 8760 hours. HbA1C: No results for input(s): "HGBA1C" in the last 72 hours. CBG: No results for input(s): "GLUCAP" in the last 168 hours. Lipid Profile: No results for input(s): "CHOL", "HDL", "LDLCALC", "TRIG", "CHOLHDL",  "LDLDIRECT" in the last 72 hours. Thyroid Function Tests: No results for input(s): "TSH", "T4TOTAL", "FREET4", "T3FREE", "THYROIDAB" in the last 72 hours. Anemia Panel: No results for input(s): "VITAMINB12", "FOLATE", "FERRITIN", "TIBC", "IRON", "RETICCTPCT" in the last 72 hours. Urine analysis:    Component Value Date/Time   COLORURINE YELLOW 08/19/2023 0003   APPEARANCEUR CLEAR 08/19/2023 0003   LABSPEC 1.016 08/19/2023 0003   PHURINE 9.0 (H) 08/19/2023 0003   GLUCOSEU NEGATIVE 08/19/2023 0003   HGBUR SMALL (A) 08/19/2023 0003   BILIRUBINUR NEGATIVE 08/19/2023 0003   BILIRUBINUR negative 01/01/2018 1647   KETONESUR NEGATIVE 08/19/2023 0003   PROTEINUR 100 (A) 08/19/2023 0003   UROBILINOGEN 0.2 09/21/2020 1726   NITRITE NEGATIVE 08/19/2023 0003   LEUKOCYTESUR NEGATIVE 08/19/2023 0003   Sepsis Labs: @LABRCNTIP (procalcitonin:4,lacticidven:4)  )No results found for this or any previous visit (from the past 240 hours).       Radiology Studies: VAS US  UPPER EXTREMITY VENOUS DUPLEX Result Date: 08/19/2023 UPPER VENOUS STUDY  Patient Name:  Erin Hudson  Date of Exam:   08/18/2023 Medical Rec #: [id number]     Accession #:    [id number] Date of Birth: 1986-03-14      Patient Gender: F Patient Age:   38 years Exam Location:  [facility] Procedure:      VAS US  UPPER EXTREMITY VENOUS DUPLEX Referring Phys: Bretta Camp Jodine Muchmore --------------------------------------------------------------------------------  Indications: Swelling, and bilateral swelling in both arms after working out. Comparison Study: No priors. Performing Technologist: Franky Ivanoff Sturdivant-Jones RDMS, RVT  Examination Guidelines: A complete evaluation includes B-mode imaging, spectral Doppler, color Doppler, and power Doppler as needed of all accessible portions of each vessel. Bilateral testing is considered an integral part of a complete examination. Limited examinations for reoccurring indications may be performed as noted.   Right Findings: +----------+------------+---------+-----------+----------+-------+ RIGHT     CompressiblePhasicitySpontaneousPropertiesSummary +----------+------------+---------+-----------+----------+-------+ IJV           Full       Yes       Yes                      +----------+------------+---------+-----------+----------+-------+ Subclavian    Full       Yes       Yes                      +----------+------------+---------+-----------+----------+-------+ Axillary      Full       Yes       Yes                      +----------+------------+---------+-----------+----------+-------+  Brachial      Full                                          +----------+------------+---------+-----------+----------+-------+ Radial        Full                                          +----------+------------+---------+-----------+----------+-------+ Ulnar         Full                                          +----------+------------+---------+-----------+----------+-------+ Cephalic      Full                                          +----------+------------+---------+-----------+----------+-------+ Basilic       Full                                          +----------+------------+---------+-----------+----------+-------+  Left Findings: +----------+------------+---------+-----------+----------+-------+ LEFT      CompressiblePhasicitySpontaneousPropertiesSummary +----------+------------+---------+-----------+----------+-------+ IJV           Full       Yes       Yes                      +----------+------------+---------+-----------+----------+-------+ Subclavian    Full       Yes       Yes                      +----------+------------+---------+-----------+----------+-------+ Axillary      Full       Yes       Yes                      +----------+------------+---------+-----------+----------+-------+ Brachial      Full                                           +----------+------------+---------+-----------+----------+-------+ Radial        Full                                          +----------+------------+---------+-----------+----------+-------+ Ulnar         Full                                          +----------+------------+---------+-----------+----------+-------+ Cephalic      Full                                          +----------+------------+---------+-----------+----------+-------+ Basilic  Full                                          +----------+------------+---------+-----------+----------+-------+  Summary: No evidence of deep vein or superficial vein thrombosis involving the right and left upper extremities.  *See table(s) above for measurements and observations.  Diagnosing physician: Irvin Mantel Electronically signed by Irvin Mantel on 08/19/2023 at 10:20:40 AM.    Final         Scheduled Meds: Continuous Infusions:  sodium bicarbonate 150 mEq in dextrose 5 % 1,150 mL infusion 200 mL/hr at 08/19/23 1613     LOS: 0 days    Time spent: 35 Minutes    Fonnie Iba, MD  [facility] Pager #: [phone] 7PM-7AM contact night coverage as above

## 2023-08-20 LAB — CK
Total CK: 16703 U/L — ABNORMAL HIGH (ref 38–234)
Total CK: 14907 U/L — ABNORMAL HIGH (ref 38–234)

## 2023-08-20 NOTE — Plan of Care (Signed)

## 2023-08-20 NOTE — Progress Notes (Signed)
 Pt and family has concerns prior to possible d/c tomorrow (08/20/23). Pt is concerned w/ lab levels and feels it would be best to stay until labs are within normal range. MD made aware.

## 2023-08-20 NOTE — Progress Notes (Signed)
 PROGRESS NOTE    Erin Hudson  WUJ:[id number] DOB: March 07, 1986 DOA: 08/17/2023 PCP: Ostwalt, Janna, PA-C  Outpatient Specialists:     Brief Narrative:  Admitted earlier today with rhabdomyolysis.  Renal function is normal.   08/19/2023: CK remains greater than 20,000.  Normal renal function.  Vascular Doppler ultrasound of bilateral upper extremities was negative for DVT.  Patient continues to report swelling of both upper extremities.  Seen alongside patient's mother and aunt, in the presence of patient's nurse.  Check CK twice daily.  Continue aggressive hydration.  Continue to monitor renal function closely.  08/20/2023: Patient seen alongside patient's mother.  No new complaints.  CPK is down to 16,000+.  Continue IV fluids.  Continue to monitor CPK levels.  Likely discharge home tomorrow.  No migraine headache reported.    Assessment & Plan:   Principal Problem:   Rhabdomyolysis Active Problems:   Asthma, chronic   Transaminitis   Rhabdomyolysis: -CPK is down to 16,703.   -Continue to monitor renal function and CK -Negative UDS -Continue IVF at 200 cc/hour    Transaminitis In the setting of rhabdomyolysis.      Mild leukocytosis Resolved.   Mild intermittent asthma Stable.   DVT prophylaxis: SCD Code Status: Full code Family Communication: Mother at the bedside Disposition Plan: Observation   Consultants:  None  Procedures:  None  Antimicrobials:  None    Subjective: No new complaints  Objective: Vitals:   08/19/23 1731 08/19/23 2049 08/20/23 0443 08/20/23 0839  BP: 138/80 (!) 142/72 112/65 131/74  Pulse: 74 77 71 77  Resp:  18 18   Temp: 98.1 F (36.7 C) 98.1 F (36.7 C) 98.2 F (36.8 C)   TempSrc:  Oral Oral   SpO2: 99% 99% 97% 98%   No intake or output data in the 24 hours ending 08/20/23 1159  There were no vitals filed for this visit.  Examination:  General exam: Appears calm and comfortable  Respiratory system: Clear to  auscultation.   Cardiovascular system: S1 & S2 heard  Gastrointestinal system: Abdomen is soft and non tender Central nervous system: Alert and oriented. Patient moves all extremities. Extremities:  Concerned about swelling of upper extremities     Data Reviewed: I have personally reviewed following labs and imaging studies  CBC: Recent Labs  Lab 08/17/23 2005 08/18/23 0548  WBC 12.2* 8.3  HGB 15.1* 13.6  HCT 45.2 40.4  MCV 84.3 83.0  PLT 244 199   Basic Metabolic Panel: Recent Labs  Lab 08/17/23 2005 08/18/23 0500 08/18/23 0548 08/18/23 0553 08/18/23 1057 08/18/23 1320 08/19/23 0805  NA 140  --  137  --  140 139 141  K 4.0  --  3.4*  --  3.5 3.6 3.7  CL 108  --  107  --  106 105 105  CO2 22  --  25  --  25 23 28   GLUCOSE 104*  --  105*  --  89 111* 108*  BUN 15  --  9  --  5* 5* <5*  CREATININE 1.02* 0.90 0.85  --  0.81 0.78 0.79  CALCIUM 9.3  --  8.3*  --  8.6* 8.5* 8.9  MG  --   --   --   --   --   --  1.9  PHOS  --   --   --  3.2 3.0 3.2 3.4   GFR: CrCl cannot be calculated (Unknown ideal weight.). Liver Function Tests: Recent Labs  Lab 08/17/23 2005 08/18/23 0548 08/18/23 1057 08/18/23 1320 08/19/23 0805  AST 509* 377* 422* 388*  --   ALT 146* 128* 146* 136*  --   ALKPHOS 42 35* 37* 36*  --   BILITOT 0.6 0.6 0.5 0.6  --   PROT 6.3* 5.3* 6.0* 5.4*  --   ALBUMIN 3.5 2.8* 3.2* 3.0* 3.2*   No results for input(s): "LIPASE", "AMYLASE" in the last 168 hours. No results for input(s): "AMMONIA" in the last 168 hours. Coagulation Profile: No results for input(s): "INR", "PROTIME" in the last 168 hours. Cardiac Enzymes: Recent Labs  Lab 08/18/23 1057 08/18/23 1320 08/19/23 0805 08/19/23 1307 08/20/23 0046  CKTOTAL >20,000* >20,000* >20,000* >20,000* 16,703*   BNP (last 3 results) No results for input(s): "PROBNP" in the last 8760 hours. HbA1C: No results for input(s): "HGBA1C" in the last 72 hours. CBG: No results for input(s): "GLUCAP" in the  last 168 hours. Lipid Profile: No results for input(s): "CHOL", "HDL", "LDLCALC", "TRIG", "CHOLHDL", "LDLDIRECT" in the last 72 hours. Thyroid Function Tests: No results for input(s): "TSH", "T4TOTAL", "FREET4", "T3FREE", "THYROIDAB" in the last 72 hours. Anemia Panel: No results for input(s): "VITAMINB12", "FOLATE", "FERRITIN", "TIBC", "IRON", "RETICCTPCT" in the last 72 hours. Urine analysis:    Component Value Date/Time   COLORURINE YELLOW 08/19/2023 0003   APPEARANCEUR CLEAR 08/19/2023 0003   LABSPEC 1.016 08/19/2023 0003   PHURINE 9.0 (H) 08/19/2023 0003   GLUCOSEU NEGATIVE 08/19/2023 0003   HGBUR SMALL (A) 08/19/2023 0003   BILIRUBINUR NEGATIVE 08/19/2023 0003   BILIRUBINUR negative 01/01/2018 1647   KETONESUR NEGATIVE 08/19/2023 0003   PROTEINUR 100 (A) 08/19/2023 0003   UROBILINOGEN 0.2 09/21/2020 1726   NITRITE NEGATIVE 08/19/2023 0003   LEUKOCYTESUR NEGATIVE 08/19/2023 0003   Sepsis Labs: @LABRCNTIP (procalcitonin:4,lacticidven:4)  )No results found for this or any previous visit (from the past 240 hours).       Radiology Studies: No results found.       Scheduled Meds: Continuous Infusions:  sodium bicarbonate 150 mEq in dextrose 5 % 1,150 mL infusion 200 mL/hr at 08/20/23 0416     LOS: 1 day    Time spent: 35 Minutes    Fonnie Iba, MD  [facility] Pager #: [phone] 7PM-7AM contact night coverage as above

## 2023-08-21 LAB — RENAL FUNCTION PANEL
Albumin: 3.3 g/dL — ABNORMAL LOW (ref 3.5–5.0)
Anion gap: 11 (ref 5–15)
BUN: 8 mg/dL (ref 6–20)
CO2: 29 mmol/L (ref 22–32)
Calcium: 9.4 mg/dL (ref 8.9–10.3)
Chloride: 99 mmol/L (ref 98–111)
Creatinine, Ser: 0.82 mg/dL (ref 0.44–1.00)
GFR, Estimated: >60 mL/min (ref >60)
Glucose, Bld: 122 mg/dL — ABNORMAL HIGH (ref 70–99)
Phosphorus: 3.4 mg/dL (ref 2.5–4.6)
Potassium: 3.3 mmol/L — ABNORMAL LOW (ref 3.5–5.1)
Sodium: 139 mmol/L (ref 135–145)

## 2023-08-21 LAB — CK
Total CK: 10577 U/L — ABNORMAL HIGH (ref 38–234)
Total CK: 9117 U/L — ABNORMAL HIGH (ref 38–234)

## 2023-08-21 NOTE — Plan of Care (Signed)

## 2023-08-21 NOTE — Progress Notes (Signed)
 PROGRESS NOTE    Erin Hudson  ZOX:[id number] DOB: 27-Jul-1985 DOA: 08/17/2023 PCP: Ostwalt, Janna, PA-C  Outpatient Specialists:     Brief Narrative:  Admitted earlier today with rhabdomyolysis.  Total CK on presentation was greater than 20,000.  Renal function has remained within normal range.     08/19/2023: CK remains greater than 20,000.  Normal renal function.  Vascular Doppler ultrasound of bilateral upper extremities was negative for DVT.  Patient continues to report swelling of both upper extremities.  Seen alongside patient's mother and aunt, in the presence of patient's nurse.  Check CK twice daily.  Continue aggressive hydration.  Continue to monitor renal function closely.  08/20/2023: Patient seen alongside patient's mother.  No new complaints.  CPK is down to 16,000+.  Continue IV fluids.  Continue to monitor CPK levels.  Likely discharge home tomorrow.  No migraine headache reported.    08/21/2023: Patient seen alongside patient's mother.  CK is 10,577.  Patient remains on aggressive hydration.  Renal function is normal.  Patient and patient's mother are not keen on being discharged back home.  Assessment & Plan:   Principal Problem:   Rhabdomyolysis Active Problems:   Asthma, chronic   Transaminitis   Rhabdomyolysis: -CPK is down to 9117.     -Continue to monitor renal function and CK -Negative UDS -Continue IVF at 200 cc/hour    Transaminitis In the setting of rhabdomyolysis.      Mild leukocytosis Resolved.   Mild intermittent asthma Stable.   DVT prophylaxis: SCD Code Status: Full code Family Communication: Mother at the bedside Disposition Plan: Observation   Consultants:  None  Procedures:  None  Antimicrobials:  None    Subjective: No new complaints  Objective: Vitals:   08/20/23 1622 08/20/23 1639 08/20/23 1936 08/21/23 0515  BP: (!) 155/96 (!) 153/84 (!) 149/93 136/82  Pulse: 87 75 72 69  Resp: 18 18 (!) 22 18  Temp: 98.9 F  (37.2 C) 98.4 F (36.9 C) 99.4 F (37.4 C) 98.6 F (37 C)  TempSrc: Oral Oral Oral   SpO2: 100% 100% 99% 100%   No intake or output data in the 24 hours ending 08/21/23 0719  There were no vitals filed for this visit.  Examination:  General exam: Appears calm and comfortable  Respiratory system: Clear to auscultation.   Cardiovascular system: S1 & S2 heard  Gastrointestinal system: Abdomen is soft and non tender Central nervous system: Alert and oriented. Patient moves all extremities. Extremities:  Concerned about swelling of upper extremities     Data Reviewed: I have personally reviewed following labs and imaging studies  CBC: Recent Labs  Lab 08/17/23 2005 08/18/23 0548  WBC 12.2* 8.3  HGB 15.1* 13.6  HCT 45.2 40.4  MCV 84.3 83.0  PLT 244 199   Basic Metabolic Panel: Recent Labs  Lab 08/17/23 2005 08/18/23 0500 08/18/23 0548 08/18/23 0553 08/18/23 1057 08/18/23 1320 08/19/23 0805  NA 140  --  137  --  140 139 141  K 4.0  --  3.4*  --  3.5 3.6 3.7  CL 108  --  107  --  106 105 105  CO2 22  --  25  --  25 23 28   GLUCOSE 104*  --  105*  --  89 111* 108*  BUN 15  --  9  --  5* 5* <5*  CREATININE 1.02* 0.90 0.85  --  0.81 0.78 0.79  CALCIUM 9.3  --  8.3*  --  8.6* 8.5* 8.9  MG  --   --   --   --   --   --  1.9  PHOS  --   --   --  3.2 3.0 3.2 3.4   GFR: CrCl cannot be calculated (Unknown ideal weight.). Liver Function Tests: Recent Labs  Lab 08/17/23 2005 08/18/23 0548 08/18/23 1057 08/18/23 1320 08/19/23 0805  AST 509* 377* 422* 388*  --   ALT 146* 128* 146* 136*  --   ALKPHOS 42 35* 37* 36*  --   BILITOT 0.6 0.6 0.5 0.6  --   PROT 6.3* 5.3* 6.0* 5.4*  --   ALBUMIN 3.5 2.8* 3.2* 3.0* 3.2*   No results for input(s): "LIPASE", "AMYLASE" in the last 168 hours. No results for input(s): "AMMONIA" in the last 168 hours. Coagulation Profile: No results for input(s): "INR", "PROTIME" in the last 168 hours. Cardiac Enzymes: Recent Labs  Lab  08/19/23 0805 08/19/23 1307 08/20/23 0046 08/20/23 1258 08/21/23 0054  CKTOTAL >20,000* >20,000* 16,703* 14,907* 10,577*   BNP (last 3 results) No results for input(s): "PROBNP" in the last 8760 hours. HbA1C: No results for input(s): "HGBA1C" in the last 72 hours. CBG: No results for input(s): "GLUCAP" in the last 168 hours. Lipid Profile: No results for input(s): "CHOL", "HDL", "LDLCALC", "TRIG", "CHOLHDL", "LDLDIRECT" in the last 72 hours. Thyroid Function Tests: No results for input(s): "TSH", "T4TOTAL", "FREET4", "T3FREE", "THYROIDAB" in the last 72 hours. Anemia Panel: No results for input(s): "VITAMINB12", "FOLATE", "FERRITIN", "TIBC", "IRON", "RETICCTPCT" in the last 72 hours. Urine analysis:    Component Value Date/Time   COLORURINE YELLOW 08/19/2023 0003   APPEARANCEUR CLEAR 08/19/2023 0003   LABSPEC 1.016 08/19/2023 0003   PHURINE 9.0 (H) 08/19/2023 0003   GLUCOSEU NEGATIVE 08/19/2023 0003   HGBUR SMALL (A) 08/19/2023 0003   BILIRUBINUR NEGATIVE 08/19/2023 0003   BILIRUBINUR negative 01/01/2018 1647   KETONESUR NEGATIVE 08/19/2023 0003   PROTEINUR 100 (A) 08/19/2023 0003   UROBILINOGEN 0.2 09/21/2020 1726   NITRITE NEGATIVE 08/19/2023 0003   LEUKOCYTESUR NEGATIVE 08/19/2023 0003   Sepsis Labs: @LABRCNTIP (procalcitonin:4,lacticidven:4)  )No results found for this or any previous visit (from the past 240 hours).       Radiology Studies: No results found.       Scheduled Meds: Continuous Infusions:  sodium bicarbonate 150 mEq in dextrose 5 % 1,150 mL infusion 200 mL/hr at 08/21/23 0536     LOS: 2 days    Time spent: 66 Minutes    Fonnie Iba, MD  [facility] Pager #: [phone] 7PM-7AM contact night coverage as above

## 2023-08-22 LAB — RENAL FUNCTION PANEL
Albumin: 3 g/dL — ABNORMAL LOW (ref 3.5–5.0)
Anion gap: 7 (ref 5–15)
BUN: 7 mg/dL (ref 6–20)
CO2: 32 mmol/L (ref 22–32)
Calcium: 9.1 mg/dL (ref 8.9–10.3)
Chloride: 99 mmol/L (ref 98–111)
Creatinine, Ser: 0.77 mg/dL (ref 0.44–1.00)
GFR, Estimated: >60 mL/min (ref >60)
Glucose, Bld: 123 mg/dL — ABNORMAL HIGH (ref 70–99)
Phosphorus: 3.4 mg/dL (ref 2.5–4.6)
Potassium: 3.1 mmol/L — ABNORMAL LOW (ref 3.5–5.1)
Sodium: 138 mmol/L (ref 135–145)

## 2023-08-22 MED ORDER — POTASSIUM CHLORIDE CRYS ER 20 MEQ PO TBCR
40.0000 meq | EXTENDED_RELEASE_TABLET | Freq: Two times a day (BID) | ORAL | Status: DC
  Administered 2023-08-22: 40 meq via ORAL
  Filled 2023-08-22: qty 2

## 2023-08-22 MED ORDER — POTASSIUM CHLORIDE CRYS ER 20 MEQ PO TBCR: 20.0000 meq | EXTENDED_RELEASE_TABLET | Freq: Every day | ORAL | 0 refills | Status: DC

## 2023-08-22 NOTE — Discharge Summary (Signed)
 Physician Discharge Summary  Erin Hudson MWU:[id number] DOB: 1985-04-23 DOA: 08/17/2023  PCP: Ostwalt, Janna, PA-C  Admit date: 08/17/2023 Discharge date: 08/22/2023  Admitted From: Home  Discharge disposition: Home   Recommendations for Outpatient Follow-Up:   Follow up with your primary care provider in one week.  Check CBC, BMP, magnesium and CK levels in the next visit Patient encouraged on oral hydration and less strenuous exercise.  Aaron Aas  Discharge Diagnosis:   Principal Problem:   Rhabdomyolysis Active Problems:   Asthma, chronic   Transaminitis  Discharge Condition: Improved.  Diet recommendation:  Regular.  Wound care: None.  Code status: Full.   History of Present Illness:    Erin Hudson is a 38 y.o. female with medical history significant of mild intermittent asthma presents to the ED with bilateral upper extremity swelling in the setting of upper extremity weightlifting workout 3 days ago.  In the ED, vitals were notable for elevated blood pressure. Labs notable for WBC count 12.2, hemoglobin 15.1, potassium 4.0, creatinine 1.0, calcium 9.3, albumin 3.5, AST 509, ALT 146, alk phos and T. bili normal, CK >20,000, UA consistent with myoglobinuria.  EKG showing sinus rhythm and mild T wave inversions inferolaterally.  No previous EKG for comparison. Patient was given 1 L normal saline and started on sodium bicarb infusion at 200 mL/h.   Hospital Course:   Following conditions were addressed during hospitalization as listed below,  Rhabdomyolysis Presented with bilateral upper extremity swelling and mild soreness of the arms and forearms in the setting of upper extremity weightlifting workout 3 days ago.  Neurovascularly intact and no signs of compartment syndrome.  CK >20,000, UA consistent with myoglobinuria, presentation.  Has significantly received IV fluids with improvement in levels levels with preserved renal function.    Elevated LFT. In the setting of  rhabdomyolysis.    EKG abnormality Asymptomatic.  Mild T wave inversion.  No chest pain.  Negative troponin.  Nonspecific.   Mild leukocytosis Reactive.   Mild intermittent asthma Stable, no signs of acute exacerbation.  Albuterol  PRN.  Mild hypokalemia.  Will give her 40 mill equivalents of potassium prior to discharge.  Continue 20 mEq of potassium..  Check BMP in the next visit with primary care provider.  Disposition.  At this time, patient is stable for disposition home with outpatient PCP follow-up.  Medical Consultants:   None.  Procedures:    None Subjective:   Today, patient was seen and examined at bedside.  Has some discomfort over the arms but no shortness of breath dyspnea.  Has been urinating well.  Discharge Exam:   Vitals:   08/22/23 0525 08/22/23 0727  BP: (!) 126/59 (!) 145/94  Pulse: 67 76  Resp: 18 17  Temp: 98.1 F (36.7 C) 98.4 F (36.9 C)  SpO2: 99% 98%   Vitals:   08/21/23 1608 08/21/23 1958 08/22/23 0525 08/22/23 0727  BP: (!) 149/93 (!) 166/85 (!) 126/59 (!) 145/94  Pulse: 83 66 67 76  Resp: 18 18 18 17   Temp: 98 F (36.7 C) 98 F (36.7 C) 98.1 F (36.7 C) 98.4 F (36.9 C)  TempSrc:      SpO2: 99% 98% 99% 98%   There is no height or weight on file to calculate BMI.  General: Alert awake, not in obvious distress HENT: pupils equally reacting to light,  No scleral pallor or icterus noted. Oral mucosa is moist.  Chest:  Clear breath sounds. No crackles or wheezes.  CVS: S1 &  S2 heard. No murmur.  Regular rate and rhythm. Abdomen: Soft, nontender, nondistended.  Bowel sounds are heard.   Extremities: No cyanosis, clubbing or edema.  Peripheral pulses are palpable.  Nontender upper extremities. Psych: Alert, awake and oriented, normal mood CNS:  No cranial nerve deficits.  Power equal in all extremities.   Skin: Warm and dry.  No rashes noted.  The results of significant diagnostics from this hospitalization (including imaging,  microbiology, ancillary and laboratory) are listed below for reference.     Diagnostic Studies:   VAS US  UPPER EXTREMITY VENOUS DUPLEX Result Date: 08/19/2023 UPPER VENOUS STUDY  Patient Name:  Erin Hudson  Date of Exam:   08/18/2023 Medical Rec #: [id number]     Accession #:    [id number] Date of Birth: March 21, 1986      Patient Gender: F Patient Age:   49 years Exam Location:  [facility] Procedure:      VAS US  UPPER EXTREMITY VENOUS DUPLEX Referring Phys: Bretta Camp OGBATA --------------------------------------------------------------------------------  Indications: Swelling, and bilateral swelling in both arms after working out. Comparison Study: No priors. Performing Technologist: Franky Ivanoff Sturdivant-Jones RDMS, RVT  Examination Guidelines: A complete evaluation includes B-mode imaging, spectral Doppler, color Doppler, and power Doppler as needed of all accessible portions of each vessel. Bilateral testing is considered an integral part of a complete examination. Limited examinations for reoccurring indications may be performed as noted.  Right Findings: +----------+------------+---------+-----------+----------+-------+ RIGHT     CompressiblePhasicitySpontaneousPropertiesSummary +----------+------------+---------+-----------+----------+-------+ IJV           Full       Yes       Yes                      +----------+------------+---------+-----------+----------+-------+ Subclavian    Full       Yes       Yes                      +----------+------------+---------+-----------+----------+-------+ Axillary      Full       Yes       Yes                      +----------+------------+---------+-----------+----------+-------+ Brachial      Full                                          +----------+------------+---------+-----------+----------+-------+ Radial        Full                                           +----------+------------+---------+-----------+----------+-------+ Ulnar         Full                                          +----------+------------+---------+-----------+----------+-------+ Cephalic      Full                                          +----------+------------+---------+-----------+----------+-------+ Basilic       Full                                          +----------+------------+---------+-----------+----------+-------+  Left Findings: +----------+------------+---------+-----------+----------+-------+ LEFT      CompressiblePhasicitySpontaneousPropertiesSummary +----------+------------+---------+-----------+----------+-------+ IJV           Full       Yes       Yes                      +----------+------------+---------+-----------+----------+-------+ Subclavian    Full       Yes       Yes                      +----------+------------+---------+-----------+----------+-------+ Axillary      Full       Yes       Yes                      +----------+------------+---------+-----------+----------+-------+ Brachial      Full                                          +----------+------------+---------+-----------+----------+-------+ Radial        Full                                          +----------+------------+---------+-----------+----------+-------+ Ulnar         Full                                          +----------+------------+---------+-----------+----------+-------+ Cephalic      Full                                          +----------+------------+---------+-----------+----------+-------+ Basilic       Full                                          +----------+------------+---------+-----------+----------+-------+  Summary: No evidence of deep vein or superficial vein thrombosis involving the right and left upper extremities.  *See table(s) above for measurements and observations.  Diagnosing  physician: Irvin Mantel Electronically signed by Irvin Mantel on 08/19/2023 at 10:20:40 AM.    Final      Labs:   Basic Metabolic Panel: Recent Labs  Lab 08/18/23 1057 08/18/23 1320 08/19/23 0805 08/21/23 0828 08/22/23 0447  NA 140 139 141 139 138  K 3.5 3.6 3.7 3.3* 3.1*  CL 106 105 105 99 99  CO2 25 23 28 29  32  GLUCOSE 89 111* 108* 122* 123*  BUN 5* 5* <5* 8 7  CREATININE 0.81 0.78 0.79 0.82 0.77  CALCIUM 8.6* 8.5* 8.9 9.4 9.1  MG  --   --  1.9  --   --   PHOS 3.0 3.2 3.4 3.4 3.4   GFR CrCl cannot be calculated (Unknown ideal weight.). Liver Function Tests: Recent Labs  Lab 08/17/23 2005 08/18/23 0548 08/18/23 1057 08/18/23 1320 08/19/23 0805 08/21/23 0828 08/22/23 0447  AST 509* 377* 422* 388*  --   --   --   ALT 146* 128* 146* 136*  --   --   --  ALKPHOS 42 35* 37* 36*  --   --   --   BILITOT 0.6 0.6 0.5 0.6  --   --   --   PROT 6.3* 5.3* 6.0* 5.4*  --   --   --   ALBUMIN 3.5 2.8* 3.2* 3.0* 3.2* 3.3* 3.0*   No results for input(s): "LIPASE", "AMYLASE" in the last 168 hours. No results for input(s): "AMMONIA" in the last 168 hours. Coagulation profile No results for input(s): "INR", "PROTIME" in the last 168 hours.  CBC: Recent Labs  Lab 08/17/23 2005 08/18/23 0548  WBC 12.2* 8.3  HGB 15.1* 13.6  HCT 45.2 40.4  MCV 84.3 83.0  PLT 244 199   Cardiac Enzymes: Recent Labs  Lab 08/19/23 1307 08/20/23 0046 08/20/23 1258 08/21/23 0054 08/21/23 0506  CKTOTAL >20,000* 16,703* 14,907* 10,577* 9,117*   BNP: Invalid input(s): "POCBNP" CBG: No results for input(s): "GLUCAP" in the last 168 hours. D-Dimer No results for input(s): "DDIMER" in the last 72 hours. Hgb A1c No results for input(s): "HGBA1C" in the last 72 hours. Lipid Profile No results for input(s): "CHOL", "HDL", "LDLCALC", "TRIG", "CHOLHDL", "LDLDIRECT" in the last 72 hours. Thyroid function studies No results for input(s): "TSH", "T4TOTAL", "T3FREE", "THYROIDAB" in the last 72  hours.  Invalid input(s): "FREET3" Anemia work up No results for input(s): "VITAMINB12", "FOLATE", "FERRITIN", "TIBC", "IRON", "RETICCTPCT" in the last 72 hours. Microbiology No results found for this or any previous visit (from the past 240 hours).   Discharge Instructions:   Discharge Instructions     Call MD for:  severe uncontrolled pain   Complete by: As directed    Diet general   Complete by: As directed    Discharge instructions   Complete by: As directed    Please drink plenty of fluids.  Take potassium for next few days.  Check blood work and follow-up with your primary care provider within 1 week.  Seek medical attention for worsening symptoms.   Increase activity slowly   Complete by: As directed       Allergies as of 08/22/2023       Reactions   Azithromycin  Nausea Only, Other (See Comments)   Abdominal pain, dizziness        Medication List     TAKE these medications    acetaminophen  500 MG tablet Commonly known as: TYLENOL  Take 1,000 mg by mouth every 6 (six) hours as needed for mild pain (pain score 1-3) or headache.   albuterol  (2.5 MG/3ML) 0.083% nebulizer solution Commonly known as: PROVENTIL  USE 1 VIAL BY NEBULIZATION EVERY 6 HOURS AS NEEDED FOR WHEEZING OR SHORTNES OF BREATH What changed: Another medication with the same name was changed. Make sure you understand how and when to take each.   albuterol  108 (90 Base) MCG/ACT inhaler Commonly known as: VENTOLIN  HFA Inhale 1-2 puffs into the lungs every 4 (four) hours as needed for wheezing or shortness of breath. What changed: how much to take   EPINEPHrine  0.3 mg/0.3 mL Soaj injection Commonly known as: EPI-PEN Inject 0.3 mg into the muscle as needed for anaphylaxis.   norgestimate -ethinyl estradiol  0.25-35 MG-MCG tablet Commonly known as: ORTHO-CYCLEN Take 1 tablet by mouth daily.   potassium chloride SA 20 MEQ tablet Commonly known as: KLOR-CON M Take 1 tablet (20 mEq total) by mouth  daily for 5 days.          Time coordinating discharge: 39 minutes  Signed:  Dhiya Smits  [facility] 08/22/2023, 1:24 PM

## 2023-08-23 ENCOUNTER — Telehealth: Payer: Self-pay

## 2023-08-23 NOTE — Transitions of Care (Post Inpatient/ED Visit) (Signed)
   08/23/2023  Name: Erin Hudson MRN: [id number] DOB: 05-04-1985  Today's TOC FU Call Status: Today's TOC FU Call Status:: Successful TOC FU Call Completed TOC FU Call Complete Date: 08/23/23 Patient's Name and Date of Birth confirmed.  Transition Care Management Follow-up Telephone Call Date of Discharge: 08/22/23 Discharge Facility: [facility]) Type of Discharge: Inpatient Admission Primary Inpatient Discharge Diagnosis:: rhabdomyolysis How have you been since you were released from the hospital?: Better Any questions or concerns?: No  Items Reviewed: Did you receive and understand the discharge instructions provided?: Yes Medications obtained,verified, and reconciled?: Yes (Medications Reviewed) Any new allergies since your discharge?: No Dietary orders reviewed?: Yes Do you have support at home?: Yes People in Home [RPT]: parent(s)  Medications Reviewed Today: Medications Reviewed Today     Reviewed by Darrall Ellison, LPN (Licensed Practical Nurse) on 08/23/23 at 0935  Med List Status: <None>   Medication Order Taking? Sig Documenting Provider Last Dose Status Informant  acetaminophen  (TYLENOL ) 500 MG tablet [id number] No Take 1,000 mg by mouth every 6 (six) hours as needed for mild pain (pain score 1-3) or headache. [provider] 08/17/2023 Morning Active Self, Pharmacy Records  albuterol  (PROVENTIL ) (2.5 MG/3ML) 0.083% nebulizer solution [id number] No USE 1 VIAL BY NEBULIZATION EVERY 6 HOURS AS NEEDED FOR WHEEZING OR SHORTNES OF BREATH Ostwalt, Janna, PA-C Unknown Active Self, Pharmacy Records  albuterol  (VENTOLIN  HFA) 108 (90 Base) MCG/ACT inhaler [id number] No Inhale 1-2 puffs into the lungs every 4 (four) hours as needed for wheezing or shortness of breath.  Patient taking differently: Inhale 2 puffs into the lungs every 4 (four) hours as needed for wheezing or shortness of breath.   Ostwalt, Janna, PA-C Past Week Active Self, Pharmacy Records  EPINEPHrine  0.3  mg/0.3 mL IJ SOAJ injection [id number] No Inject 0.3 mg into the muscle as needed for anaphylaxis.  Patient not taking: Reported on 08/18/2023   Ostwalt, Janna, PA-C Not Taking Active Self, Pharmacy Records  norgestimate -ethinyl estradiol  (ORTHO-CYCLEN) 0.25-35 MG-MCG tablet [id number] No Take 1 tablet by mouth daily. Ostwalt, Janna, PA-C 08/17/2023 Morning Active Self, Pharmacy Records  potassium chloride SA (KLOR-CON M) 20 MEQ tablet [id number]  Take 1 tablet (20 mEq total) by mouth daily for 5 days. Pokhrel, Amador Bad, MD  Active             Home Care and Equipment/Supplies: Were Home Health Services Ordered?: NA Any new equipment or medical supplies ordered?: NA  Functional Questionnaire: Do you need assistance with bathing/showering or dressing?: No Do you need assistance with meal preparation?: No Do you need assistance with eating?: No Do you have difficulty maintaining continence: No Do you need assistance with getting out of bed/getting out of a chair/moving?: No Do you have difficulty managing or taking your medications?: No  Follow up appointments reviewed: PCP Follow-up appointment confirmed?: Yes Date of PCP follow-up appointment?: 09/05/23 Follow-up Provider: [facility] Follow-up appointment confirmed?: NA Do you need transportation to your follow-up appointment?: No Do you understand care options if your condition(s) worsen?: Yes-patient verbalized understanding    SIGNATURE Darrall Ellison, LPN [facility] Nurse Health Advisor Direct Dial [phone]

## 2023-09-02 ENCOUNTER — Other Ambulatory Visit: Payer: Self-pay | Admitting: Physician Assistant

## 2023-09-02 DIAGNOSIS — J4520 Mild intermittent asthma, uncomplicated: Secondary | ICD-10-CM

## 2023-09-04 ENCOUNTER — Encounter: Payer: Self-pay | Admitting: Family Medicine

## 2023-09-04 ENCOUNTER — Ambulatory Visit: Payer: Self-pay | Admitting: Family Medicine

## 2023-09-04 ENCOUNTER — Ambulatory Visit (INDEPENDENT_AMBULATORY_CARE_PROVIDER_SITE_OTHER): Payer: Self-pay | Admitting: Family Medicine

## 2023-09-04 VITALS — BP 128/66 | HR 86 | Ht 70.0 in | Wt 197.0 lb

## 2023-09-04 DIAGNOSIS — M6282 Rhabdomyolysis: Secondary | ICD-10-CM

## 2023-09-04 LAB — POCT URINALYSIS DIPSTICK
Bilirubin, UA: NEGATIVE
Glucose, UA: NEGATIVE
Ketones, UA: NEGATIVE
Leukocytes, UA: NEGATIVE
Nitrite, UA: NEGATIVE
Protein, UA: NEGATIVE
Spec Grav, UA: 1.02 (ref 1.010–1.025)
Urobilinogen, UA: 0.2 U/dL
pH, UA: 6 (ref 5.0–8.0)

## 2023-09-04 NOTE — Progress Notes (Addendum)
 Established patient visit   Patient: Erin Hudson   DOB: 1985/12/09   37 y.o. Female  MRN: [id number] Visit Date: 09/04/2023  Today's healthcare provider: Mimi Alt, MD   Chief Complaint  Patient presents with   Hospitalization Follow-up    She was admitted 5 day for Rhabdomyolysis she was advised to follow up with PCP to have levels rechecked     Subjective     HPI     Hospitalization Follow-up    Additional comments: She was admitted 5 day for Rhabdomyolysis she was advised to follow up with PCP to have levels rechecked        Last edited by Bart Lieu, CMA on 09/04/2023 10:07 AM.       Discussed the use of AI scribe software for clinical note transcription with the patient, who gave verbal consent to proceed.  Toc call completed on 08/23/23 History of Present Illness Erin Hudson is a 38 year old female who presents for a hospital follow-up after being admitted for rhabdomyolysis.  She was admitted to the hospital from May 22 to May 27 for rhabdomyolysis following a weightlifting session. She experienced bilateral arm swelling, severe enough to prevent her from bending her arms. The swelling began at work and worsened after a nap at home, prompting her to seek urgent care. She was advised to go to the emergency department, where she was admitted for five days and received extensive IV hydration.  During her hospital stay, her creatine kinase (CK) levels were extremely elevated, initially unreadable by the machine for the first three days, with a CK level greater than 20,000. Myoglobinuria was noted in her urinalysis. An ultrasound of her upper extremities showed no deep vein thrombosis (DVT). Her EKG showed mild T wave inversions, and her blood pressure was elevated at 145. She was treated with fluids and monitored for kidney function.  She did not notice any abnormal urine color during her hospital stay and did not experience typical symptoms of  rhabdomyolysis affecting the legs. She drove herself to the hospital and was independent in her activities during her stay. Post-discharge, she has been hydrating well and has canceled her gym membership to avoid further strenuous exercise.  She experienced a severe migraine and vomiting during her hospital stay, which she attributes to emotional stress. Her sleep schedule has been disrupted, with better sleep in the morning than at night due to disturbances from hospital equipment. 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  Her past medical history includes mild intermittent asthma. She denies any history of smoking or supplement use for workouts. She notes a decrease in both the frequency and volume of urination since her hospital discharge.     Past Medical History:  Diagnosis Date   Asthma     Medications: Outpatient Medications Prior to Visit  Medication Sig   acetaminophen  (TYLENOL ) 500 MG tablet Take 1,000 mg by mouth every 6 (six) hours as needed for mild pain (pain score 1-3) or headache.   albuterol  (PROVENTIL ) (2.5 MG/3ML) 0.083% nebulizer solution USE 1 VIAL BY NEBULIZATION EVERY 6 HOURS AS NEEDED FOR WHEEZING OR SHORTNES OF BREATH   albuterol  (VENTOLIN  HFA) 108 (90 Base) MCG/ACT inhaler Inhale 2 puffs into the lungs every 4 (four) hours as needed for wheezing or shortness of breath.   norgestimate -ethinyl estradiol  (ORTHO-CYCLEN) 0.25-35 MG-MCG tablet Take 1 tablet by mouth daily.   EPINEPHrine  0.3 mg/0.3 mL IJ SOAJ  injection Inject 0.3 mg into the muscle as needed for anaphylaxis. (Patient not taking: Reported on 08/18/2023)   potassium chloride  SA (KLOR-CON  M) 20 MEQ tablet Take 1 tablet (20 mEq total) by mouth daily for 5 days.   No facility-administered medications prior to visit.    Review of Systems  Last metabolic panel Lab Results  Component Value Date   GLUCOSE 123 (H) 08/22/2023   NA 138 08/22/2023   K 3.1 (L) 08/22/2023   CL 99  08/22/2023   CO2 32 08/22/2023   BUN 7 08/22/2023   CREATININE 0.77 08/22/2023   GFRNONAA >60 08/22/2023   CALCIUM 9.1 08/22/2023   PHOS 3.4 08/22/2023   PROT 5.4 (L) 08/18/2023   ALBUMIN 3.0 (L) 08/22/2023   BILITOT 0.6 08/18/2023   ALKPHOS 36 (L) 08/18/2023   AST 388 (H) 08/18/2023   ALT 136 (H) 08/18/2023   ANIONGAP 7 08/22/2023        Objective    BP 128/66   Pulse 86   Ht 5\' 10"  (1.778 m)   Wt 197 lb (89.4 kg)   SpO2 100%   BMI 28.27 kg/m  BP Readings from Last 3 Encounters:  09/04/23 128/66  08/22/23 (!) 145/94  06/13/23 139/85   Wt Readings from Last 3 Encounters:  09/04/23 197 lb (89.4 kg)  06/13/23 204 lb (92.5 kg)  06/27/22 182 lb (82.6 kg)        Physical Exam  Physical Exam CHEST: Lungs clear to auscultation bilaterally. CARDIOVASCULAR: Regular rhythm. ABDOMEN: No costovertebral angle tenderness, no hepatomegaly, no abdominal tenderness, normal bowel sounds. EXTREMITIES: No swelling nor tenderness of upper extremities    Results for orders placed or performed in visit on 09/04/23  POCT Urinalysis Dipstick  Result Value Ref Range   Color, UA yellow    Clarity, UA clear    Glucose, UA Negative Negative   Bilirubin, UA neg    Ketones, UA neg    Spec Grav, UA 1.020 1.010 - 1.025   Blood, UA small    pH, UA 6.0 5.0 - 8.0   Protein, UA Negative Negative   Urobilinogen, UA 0.2 0.2 or 1.0 E.U./dL   Nitrite, UA neg    Leukocytes, UA Negative Negative   Appearance     Odor      Assessment & Plan     Problem List Items Addressed This Visit       Musculoskeletal and Integument   Rhabdomyolysis - Primary   Relevant Orders   CMP14+EGFR   CK   CBC   POCT Urinalysis Dipstick (Completed)   Assessment & Plan Rhabdomyolysis Acute diagnosis, resolved  Admitted for rhabdomyolysis following bilateral arm swelling post-weightlifting. CK levels were initially unreadable with myoglobinuria. Treated with IV fluids; renal function remained stable.  Swelling resolved, and she reports improvement. Emphasized hydration and reduced exercise intensity to prevent recurrence. Discussed potential for severe kidney damage, including dialysis, if not managed properly. - Order CK level and complete metabolic panel - Encourage oral hydration - Advise less strenuous exercise  Elevated Hemoglobin Hemoglobin elevated with potential clotting risks. She does not smoke or take supplements contributing to this. Plan to recheck levels to assess thrombosis risk. - Recheck hemoglobin level, CBC ordered   Asthma, chronic Mild intermittent asthma, no specific issues discussed during this visit. Focus on monitoring recovery from rhabdomyolysis and ensuring overall health stability. - Monitor asthma symptoms -continue albuterol  PRN      No follow-ups on file.  Mimi Alt, MD  [facility] [phone] (phone) [phone] (fax)  [facility]

## 2023-09-04 NOTE — Addendum Note (Signed)
 Addended by: SIMMONS-ROBINSON, Armanie Martine L on: 09/04/2023 02:56 PM   Modules accepted: Level of Service

## 2023-09-05 LAB — CMP14+EGFR
ALT: 13 IU/L (ref 0–32)
AST: 16 IU/L (ref 0–40)
Albumin: 4.5 g/dL (ref 3.9–4.9)
Alkaline Phosphatase: 57 IU/L (ref 44–121)
BUN/Creatinine Ratio: 10 (ref 9–23)
BUN: 9 mg/dL (ref 6–20)
Bilirubin Total: <0.2 mg/dL (ref 0.0–1.2)
CO2: 20 mmol/L (ref 20–29)
Calcium: 9.9 mg/dL (ref 8.7–10.2)
Chloride: 106 mmol/L (ref 96–106)
Creatinine, Ser: 0.89 mg/dL (ref 0.57–1.00)
Globulin, Total: 2.4 g/dL (ref 1.5–4.5)
Glucose: 63 mg/dL — ABNORMAL LOW (ref 70–99)
Potassium: 4.2 mmol/L (ref 3.5–5.2)
Sodium: 144 mmol/L (ref 134–144)
Total Protein: 6.9 g/dL (ref 6.0–8.5)
eGFR: 87 mL/min/{1.73_m2} (ref >59)

## 2023-09-05 LAB — CBC
Hematocrit: 42.6 % (ref 34.0–46.6)
Hemoglobin: 13.7 g/dL (ref 11.1–15.9)
MCH: 28.1 pg (ref 26.6–33.0)
MCHC: 32.2 g/dL (ref 31.5–35.7)
MCV: 88 fL (ref 79–97)
Platelets: 251 10*3/uL (ref 150–450)
RBC: 4.87 x10E6/uL (ref 3.77–5.28)
RDW: 12.6 % (ref 11.7–15.4)
WBC: 9.3 10*3/uL (ref 3.4–10.8)

## 2023-09-05 LAB — CK: Total CK: 147 U/L (ref 32–182)

## 2023-09-05 NOTE — Telephone Encounter (Signed)
 Please see the message below form the pt

## 2023-09-07 NOTE — Telephone Encounter (Signed)
 Per Dr R you schedule her 1st available

## 2023-09-07 NOTE — Telephone Encounter (Signed)
 Can someone please reach out to offer this pt a transfer of care appt per Dr Ardeth Beckers

## 2023-09-30 ENCOUNTER — Telehealth: Payer: Self-pay | Admitting: Nurse Practitioner

## 2023-09-30 DIAGNOSIS — J4520 Mild intermittent asthma, uncomplicated: Secondary | ICD-10-CM

## 2023-09-30 MED ORDER — ALBUTEROL SULFATE (2.5 MG/3ML) 0.083% IN NEBU: INHALATION_SOLUTION | RESPIRATORY_TRACT | 0 refills | Status: DC

## 2023-09-30 MED ORDER — PREDNISONE 20 MG PO TABS: 20.0000 mg | ORAL_TABLET | Freq: Every day | ORAL | 0 refills | Status: DC

## 2023-09-30 MED ORDER — ALBUTEROL SULFATE HFA 108 (90 BASE) MCG/ACT IN AERS
2.0000 | INHALATION_SPRAY | RESPIRATORY_TRACT | 0 refills | Status: DC | PRN
Start: 2023-09-30 — End: 2023-10-02

## 2023-09-30 NOTE — Progress Notes (Signed)
 Virtual Visit Consent   Erin Hudson, you are scheduled for a virtual visit with a [facility] provider today. Just as with appointments in the office, your consent must be obtained to participate. Your consent will be active for this visit and any virtual visit you may have with one of our providers in the next 365 days. If you have a MyChart account, a copy of this consent can be sent to you electronically.  As this is a virtual visit, video technology does not allow for your provider to perform a traditional examination. This may limit your provider's ability to fully assess your condition. If your provider identifies any concerns that need to be evaluated in person or the need to arrange testing (such as labs, EKG, etc.), we will make arrangements to do so. Although advances in technology are sophisticated, we cannot ensure that it will always work on either your end or our end. If the connection with a video visit is poor, the visit may have to be switched to a telephone visit. With either a video or telephone visit, we are not always able to ensure that we have a secure connection.  By engaging in this virtual visit, you consent to the provision of healthcare and authorize for your insurance to be billed (if applicable) for the services provided during this visit. Depending on your insurance coverage, you may receive a charge related to this service.  I need to obtain your verbal consent now. Are you willing to proceed with your visit today? Erin Hudson has provided verbal consent on 09/30/2023 for a virtual visit (video or telephone). Erin LELON Servant, NP  Date: 09/30/2023 8:51 AM   Virtual Visit via Video Note   I, Erin Hudson, connected with  Erin Hudson  ([id number], 16-Oct-1985) on 09/30/23 at  8:45 AM EDT by a video-enabled telemedicine application and verified that I am speaking with the correct person using two identifiers.  Location: Patient: Virtual Visit Location Patient:  Home Provider: Virtual Visit Location Provider: Home Office   I discussed the limitations of evaluation and management by telemedicine and the availability of in person appointments. The patient expressed understanding and agreed to proceed.    History of Present Illness: Erin Hudson is a 38 y.o. who identifies as a female who was assigned female at birth, and is being seen today for asthma exacerbation.   Erin Hudson states for over the past few days she has been experiencing worsening cough and shortness of breath.  She does have a history of asthma and states she is out of her inhaler and nebulizers solutions.  She does not endorse chest pain or fever.   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   Transaminitis 08/18/2023   Encounter for female birth control 06/13/2023   Chronic nonintractable headache 06/13/2023   Overweight 06/13/2023   Annual physical exam 06/29/2022   Screening for cervical cancer 06/29/2022   Establishing care with new doctor, encounter for 04/14/2022   Allergic rhinitis 06/02/2015   Asthma, chronic 06/02/2015   Contraception management 06/02/2015   Dysmenorrhea 06/02/2015   Irregular menstrual cycle 06/02/2015   Vitamin D deficiency 11/27/2008   Acne 07/24/2008   Pityriasis rosea 07/24/2008   CD (contact dermatitis) 07/16/2008    Allergies:  Allergies  Allergen Reactions   Azithromycin  Nausea Only and Other (See Comments)    Abdominal pain, dizziness   Medications:  Current Outpatient Medications:  predniSONE  (DELTASONE ) 20 MG tablet, Take 1 tablet (20 mg total) by mouth daily with breakfast for 5 days., Disp: 5 tablet, Rfl: 0   acetaminophen  (TYLENOL ) 500 MG tablet, Take 1,000 mg by mouth every 6 (six) hours as needed for mild pain (pain score 1-3) or headache., Disp: , Rfl:    albuterol  (PROVENTIL ) (2.5 MG/3ML) 0.083% nebulizer solution, USE 1 VIAL BY NEBULIZATION EVERY 6 HOURS AS NEEDED FOR WHEEZING  OR SHORTNES OF BREATH, Disp: 75 mL, Rfl: 0   albuterol  (VENTOLIN  HFA) 108 (90 Base) MCG/ACT inhaler, Inhale 2 puffs into the lungs every 4 (four) hours as needed for wheezing or shortness of breath., Disp: 18 g, Rfl: 0   EPINEPHrine  0.3 mg/0.3 mL IJ SOAJ injection, Inject 0.3 mg into the muscle as needed for anaphylaxis. (Patient not taking: Reported on 08/18/2023), Disp: 1 each, Rfl: 0   norgestimate -ethinyl estradiol  (ORTHO-CYCLEN) 0.25-35 MG-MCG tablet, Take 1 tablet by mouth daily., Disp: 28 tablet, Rfl: 11   potassium chloride  SA (KLOR-CON  M) 20 MEQ tablet, Take 1 tablet (20 mEq total) by mouth daily for 5 days., Disp: 5 tablet, Rfl: 0  Observations/Objective: Patient is well-developed, well-nourished in no acute distress.  Resting comfortably at home.  Head is normocephalic, atraumatic.  No labored breathing.  Speech is clear and coherent with logical content.  Patient is alert and oriented at baseline.    Assessment and Plan: 1. Mild intermittent asthmatic bronchitis without complication - albuterol  (PROVENTIL ) (2.5 MG/3ML) 0.083% nebulizer solution; USE 1 VIAL BY NEBULIZATION EVERY 6 HOURS AS NEEDED FOR WHEEZING OR SHORTNES OF BREATH  Dispense: 75 mL; Refill: 0 - albuterol  (VENTOLIN  HFA) 108 (90 Base) MCG/ACT inhaler; Inhale 2 puffs into the lungs every 4 (four) hours as needed for wheezing or shortness of breath.  Dispense: 18 g; Refill: 0 - predniSONE  (DELTASONE ) 20 MG tablet; Take 1 tablet (20 mg total) by mouth daily with breakfast for 5 days.  Dispense: 5 tablet; Refill: 0    Follow Up Instructions: I discussed the assessment and treatment plan with the patient. The patient was provided an opportunity to ask questions and all were answered. The patient agreed with the plan and demonstrated an understanding of the instructions.  A copy of instructions were sent to the patient via MyChart unless otherwise noted below.     The patient was advised to call back or seek an in-person  evaluation if the symptoms worsen or if the condition fails to improve as anticipated.    Lendon George W Kaytie Ratcliffe, NP

## 2023-09-30 NOTE — Patient Instructions (Signed)
 Bernice DELENA Jewels, thank you for joining [name], NP for today's virtual visit.  While this provider is not your primary care provider (PCP), if your PCP is located in our provider database this encounter information will be shared with them immediately following your visit.   A [facility] MyChart account gives you access to today's visit and all your visits, tests, and labs performed at [facility]  click here if you don't have a [facility] MyChart account or go to [email or web address]  Consent: (Patient) Bernice DELENA Jewels provided verbal consent for this virtual visit at the beginning of the encounter.  Current Medications:  Current Outpatient Medications:    predniSONE  (DELTASONE ) 20 MG tablet, Take 1 tablet (20 mg total) by mouth daily with breakfast for 5 days., Disp: 5 tablet, Rfl: 0   acetaminophen  (TYLENOL ) 500 MG tablet, Take 1,000 mg by mouth every 6 (six) hours as needed for mild pain (pain score 1-3) or headache., Disp: , Rfl:    albuterol  (PROVENTIL ) (2.5 MG/3ML) 0.083% nebulizer solution, USE 1 VIAL BY NEBULIZATION EVERY 6 HOURS AS NEEDED FOR WHEEZING OR SHORTNES OF BREATH, Disp: 75 mL, Rfl: 0   albuterol  (VENTOLIN  HFA) 108 (90 Base) MCG/ACT inhaler, Inhale 2 puffs into the lungs every 4 (four) hours as needed for wheezing or shortness of breath., Disp: 18 g, Rfl: 0   EPINEPHrine  0.3 mg/0.3 mL IJ SOAJ injection, Inject 0.3 mg into the muscle as needed for anaphylaxis. (Patient not taking: Reported on 08/18/2023), Disp: 1 each, Rfl: 0   norgestimate -ethinyl estradiol  (ORTHO-CYCLEN) 0.25-35 MG-MCG tablet, Take 1 tablet by mouth daily., Disp: 28 tablet, Rfl: 11   potassium chloride  SA (KLOR-CON  M) 20 MEQ tablet, Take 1 tablet (20 mEq total) by mouth daily for 5 days., Disp: 5 tablet, Rfl: 0   Medications ordered in this encounter:  Meds ordered this encounter  Medications   albuterol  (PROVENTIL ) (2.5 MG/3ML) 0.083% nebulizer solution    Sig: USE 1 VIAL BY  NEBULIZATION EVERY 6 HOURS AS NEEDED FOR WHEEZING OR SHORTNES OF BREATH    Dispense:  75 mL    Refill:  0    Supervising Provider:   BLAISE ALEENE KIDD [[id number]]   albuterol  (VENTOLIN  HFA) 108 (90 Base) MCG/ACT inhaler    Sig: Inhale 2 puffs into the lungs every 4 (four) hours as needed for wheezing or shortness of breath.    Dispense:  18 g    Refill:  0    Supervising Provider:   LAMPTEY, PHILIP O [[id number]]   predniSONE  (DELTASONE ) 20 MG tablet    Sig: Take 1 tablet (20 mg total) by mouth daily with breakfast for 5 days.    Dispense:  5 tablet    Refill:  0    Supervising Provider:   BLAISE ALEENE KIDD [[id number]]     *If you need refills on other medications prior to your next appointment, please contact your pharmacy*  Follow-Up: Call back or seek an in-person evaluation if the symptoms worsen or if the condition fails to improve as anticipated.  [facility] [phone]   If you have been instructed to have an in-person evaluation today at a local Urgent Care facility, please use the link below. It will take you to a list of all of our available [facility], including address, phone number and hours of operation. Please do not delay care.  [facility]  If you or a family member do not have a  primary care provider, use the link below to schedule a visit and establish care. When you choose a [facility] primary care physician or advanced practice provider, you gain a long-term partner in health. Find a Primary Care Provider  Learn more about [facility] in-office and virtual care options: [facility] Get Care Now

## 2023-10-02 ENCOUNTER — Telehealth: Payer: Self-pay | Admitting: Family Medicine

## 2023-10-02 DIAGNOSIS — J4520 Mild intermittent asthma, uncomplicated: Secondary | ICD-10-CM

## 2023-10-02 DIAGNOSIS — J4521 Mild intermittent asthma with (acute) exacerbation: Secondary | ICD-10-CM

## 2023-10-02 MED ORDER — PROMETHAZINE-DM 6.25-15 MG/5ML PO SYRP: 5.0000 mL | ORAL_SOLUTION | Freq: Four times a day (QID) | ORAL | 0 refills | Status: DC | PRN

## 2023-10-02 MED ORDER — ALBUTEROL SULFATE (2.5 MG/3ML) 0.083% IN NEBU: INHALATION_SOLUTION | RESPIRATORY_TRACT | 0 refills | Status: DC

## 2023-10-02 MED ORDER — ALBUTEROL SULFATE HFA 108 (90 BASE) MCG/ACT IN AERS: 2.0000 | INHALATION_SPRAY | RESPIRATORY_TRACT | 0 refills | Status: DC | PRN

## 2023-10-02 MED ORDER — PREDNISONE 20 MG PO TABS: 40.0000 mg | ORAL_TABLET | Freq: Every day | ORAL | 0 refills | Status: AC

## 2023-10-02 NOTE — Progress Notes (Signed)
 MyChart Video Visit    Virtual Visit via Video Note   This format is felt to be most appropriate for this patient at this time. Physical exam was limited by quality of the video and audio technology used for the visit.   Patient location: Patient's home address   Provider location: [facility]  [address]  [place], [place] [address]   I discussed the limitations of evaluation and management by telemedicine and the availability of in person appointments. The patient expressed understanding and agreed to proceed.  Patient: Erin Hudson   DOB: 1985/04/05   38 y.o. Female  MRN: [id number] Visit Date: 10/02/2023  Today's healthcare provider: Rockie Agent, MD   No chief complaint on file.  Subjective    HPI   Discussed the use of AI scribe software for clinical note transcription with the patient, who gave verbal consent to proceed.  History of Present Illness Erin Hudson is a 38 year old female with asthma who presents with continued asthma symptoms.  She has been experiencing persistent asthma symptoms despite recent treatment. Two days ago, during a virtual visit, she was prescribed an albuterol  inhaler and oral prednisone  20 mg daily for five days. However, she continues to feel poorly.  Her symptoms began last Thursday, coinciding with the emotional stress of putting her dog down. She attempted to manage her symptoms on Friday but did not improve, leading to the virtual appointment on Saturday. She feels the current prednisone  dosage is insufficient, as she is accustomed to taking a higher dose of two pills a day for five to seven days, which has been more effective in the past.  She reports a worsening cough since Saturday, which was initially mild but has since intensified. She tried over-the-counter medication, specifically maximum strength Robitussin, but did not complete the bottle and found it ineffective. Additional symptoms  include chest tightness and bilateral ear pain upon waking. No fever, headache, facial pressure, or body aches. She experiences a dry cough without sputum production.  She has been using a nebulizer but was unable to obtain an albuterol  inhaler from [facility] on Saturday due to it being out of stock. She plans to check back today for availability. Her symptoms worsen at night and she attributes some exacerbation to recent weather changes, such as temperature drops and rain after being in a warm environment.      Past Medical History:  Diagnosis Date   Asthma     Medications: Outpatient Medications Prior to Visit  Medication Sig   acetaminophen  (TYLENOL ) 500 MG tablet Take 1,000 mg by mouth every 6 (six) hours as needed for mild pain (pain score 1-3) or headache.   EPINEPHrine  0.3 mg/0.3 mL IJ SOAJ injection Inject 0.3 mg into the muscle as needed for anaphylaxis. (Patient not taking: Reported on 08/18/2023)   norgestimate -ethinyl estradiol  (ORTHO-CYCLEN) 0.25-35 MG-MCG tablet Take 1 tablet by mouth daily.   potassium chloride  SA (KLOR-CON  M) 20 MEQ tablet Take 1 tablet (20 mEq total) by mouth daily for 5 days.   [DISCONTINUED] albuterol  (PROVENTIL ) (2.5 MG/3ML) 0.083% nebulizer solution USE 1 VIAL BY NEBULIZATION EVERY 6 HOURS AS NEEDED FOR WHEEZING OR SHORTNES OF BREATH   [DISCONTINUED] albuterol  (VENTOLIN  HFA) 108 (90 Base) MCG/ACT inhaler Inhale 2 puffs into the lungs every 4 (four) hours as needed for wheezing or shortness of breath.   [DISCONTINUED] predniSONE  (DELTASONE ) 20 MG tablet Take 1 tablet (20 mg total) by mouth daily with breakfast for  5 days.   No facility-administered medications prior to visit.    Review of Systems      Objective    There were no vitals taken for this visit.      Physical Exam Vitals reviewed.  Constitutional:      General: She is not in acute distress.    Appearance: Normal appearance. She is not ill-appearing.  Pulmonary:     Effort:  Pulmonary effort is normal. No respiratory distress.     Breath sounds: No rhonchi.     Comments: Patient breathing with normal effort on RA  Neurological:     Mental Status: She is alert and oriented to person, place, and time.  Psychiatric:        Mood and Affect: Mood normal.        Behavior: Behavior normal.        Thought Content: Thought content normal.        Assessment & Plan     Problem List Items Addressed This Visit   None Visit Diagnoses       Mild intermittent asthma with exacerbation    -  Primary   Relevant Medications   promethazine -dextromethorphan (PROMETHAZINE -DM) 6.25-15 MG/5ML syrup   predniSONE  (DELTASONE ) 20 MG tablet   albuterol  (VENTOLIN  HFA) 108 (90 Base) MCG/ACT inhaler   albuterol  (PROVENTIL ) (2.5 MG/3ML) 0.083% nebulizer solution     Mild intermittent asthmatic bronchitis without complication       Relevant Medications   predniSONE  (DELTASONE ) 20 MG tablet   albuterol  (VENTOLIN  HFA) 108 (90 Base) MCG/ACT inhaler   albuterol  (PROVENTIL ) (2.5 MG/3ML) 0.083% nebulizer solution        Assessment & Plan Asthma exacerbation She is experiencing an exacerbation of asthma symptoms, likely triggered by environmental changes and emotional stress. Symptoms include chest tightness, dry cough, and ear pain. The current regimen of albuterol  inhaler and oral prednisone  20 mg daily for five days has been insufficient in managing symptoms, with worsening cough and chest tightness. Increasing the prednisone  dosage to 40 mg daily for five days is expected to better manage the symptoms, as this has been effective in the past. Promethazine  cough syrup is prescribed to help calm the cough, which is particularly bothersome at night. The use of a humidifier is advised to soothe the airways during sleep. If symptoms do not improve by Wednesday, an in-person visit is recommended for further evaluation, including potential imaging to rule out other causes. - Increase  prednisone  to 40 mg daily for five days. - Prescribe promethazine  cough syrup, to be taken up to four times a day. - Ensure availability of albuterol  inhaler; send prescription to pharmacy. - Advise use of a humidifier at night to soothe airways. - Instruct to return for an in-person visit if symptoms do not improve by Wednesday for further evaluation, including potential imaging.     No follow-ups on file.     I discussed the assessment and treatment plan with the patient. The patient was provided an opportunity to ask questions and all were answered. The patient agreed with the plan and demonstrated an understanding of the instructions.   The patient was advised to call back or seek an in-person evaluation if the symptoms worsen or if the condition fails to improve as anticipated.  I provided 15 minutes of non-face-to-face time during this encounter.   Rockie Agent, MD [facility] [phone] (phone) [phone] (fax)  [facility]

## 2023-10-17 ENCOUNTER — Telehealth: Payer: Self-pay

## 2023-10-17 ENCOUNTER — Ambulatory Visit: Payer: Self-pay

## 2023-10-17 NOTE — Telephone Encounter (Signed)
 Called pt to reschedule appt for afternoon as a virtual appt no answer. Unable to leave a message.

## 2023-10-17 NOTE — Telephone Encounter (Signed)
 FYI Only or Action Required?: Request appt  Patient was last seen in primary care on 10/02/2023 by Sharma Coyer, MD.  Called Nurse Triage reporting Fatigue.  Symptoms began several days ago.  Interventions attempted: Rest, hydration, or home remedies.  Symptoms are: unchanged.  Triage Disposition: See HCP Within 4 Hours (Or PCP Triage)  Patient/caregiver understands and will follow disposition?: YesCopied from CRM #[id number]. Topic: Clinical - Red Word Triage >> Oct 17, 2023  2:02 PM Erin Hudson wrote: Red Word that prompted transfer to Nurse Triage:  Patient was diagnosed with Rhabdomyolysis in may of 2025. The patient is experience extreme fatigue and her right arm feels funny Reason for Disposition  [1] MODERATE weakness or fatigue AND [2] from poor fluid intake AND [3] no improvement after 2 hours of rest and fluids  Answer Assessment - Initial Assessment Questions 1. DESCRIPTION: Describe how you are feeling.     Just tired 2. SEVERITY: How bad is it?  Can you stand and walk?     denies 3. ONSET: When did these symptoms begin? (e.g., hours, days, weeks, months)     Last few days 4. CAUSE: What do you think is causing the weakness or fatigue? (e.g., not drinking enough fluids, medical problem, trouble sleeping)     Rhabdomyolysis 5. NEW MEDICINES:  Have you started on any new medicines recently? (e.g., opioid pain medicines, benzodiazepines, muscle relaxants, antidepressants, antihistamines, neuroleptics, beta blockers)     denies 6. OTHER SYMPTOMS: Do you have any other symptoms? (e.g., chest pain, fever, cough, SOB, vomiting, diarrhea, bleeding, other areas of pain)     Right Arm discomfort    Pt was recently diagnosed with Rhabdomyolysis  in May. Pt states right arm feels like it's swollen but its not. Pt is sleeping 7-8 hours. Pt states the arm feels like it does when she was hospitalized. No office visits til 7/28. RN advised UC and to call back  to schedule f/u with office. Pt stated she will go to UC.  Protocols used: Weakness (Generalized) and Fatigue-A-AH

## 2023-10-17 NOTE — Telephone Encounter (Signed)
 FYI.SABRASABRASABRASABRASABRAPatient has an appointment on 10/18/2023 @ 8:40 with Lauraine Buoy, DO.

## 2023-10-18 ENCOUNTER — Ambulatory Visit: Payer: Self-pay | Admitting: Internal Medicine

## 2023-10-18 ENCOUNTER — Ambulatory Visit: Payer: Self-pay | Admitting: Family Medicine

## 2023-10-18 ENCOUNTER — Ambulatory Visit (HOSPITAL_BASED_OUTPATIENT_CLINIC_OR_DEPARTMENT_OTHER)
Admission: RE | Admit: 2023-10-18 | Discharge: 2023-10-18 | Disposition: A | Discharge status: Home / Self Care | Payer: Self-pay | Source: Ambulatory Visit | Attending: Internal Medicine | Admitting: Internal Medicine

## 2023-10-18 ENCOUNTER — Encounter: Payer: Self-pay | Admitting: Internal Medicine

## 2023-10-18 ENCOUNTER — Other Ambulatory Visit
Admission: RE | Admit: 2023-10-18 | Discharge: 2023-10-18 | Disposition: A | Discharge status: Home / Self Care | Payer: Self-pay | Attending: Internal Medicine | Admitting: Internal Medicine

## 2023-10-18 VITALS — BP 124/68 | HR 91 | Temp 98.6°F | Resp 18 | Ht 70.0 in | Wt 199.0 lb

## 2023-10-18 DIAGNOSIS — M7989 Other specified soft tissue disorders: Secondary | ICD-10-CM

## 2023-10-18 DIAGNOSIS — Z8739 Personal history of other diseases of the musculoskeletal system and connective tissue: Secondary | ICD-10-CM

## 2023-10-18 LAB — CK: Total CK: 102 U/L (ref 38–234)

## 2023-10-18 NOTE — Progress Notes (Signed)
 Acute Office Visit  Subjective:     Patient ID: Erin Hudson, female    DOB: 12-24-1985, 38 y.o.   MRN: [id number]  Chief Complaint  Patient presents with   Extremity Weakness    Right arm she states feels heavy/tight like its swollen onset 2 weeks. States just recently dx with rabdomylosis    Extremity Weakness  Pertinent negatives include no fever or tingling.   Patient is in today for right arm swelling. She is a [facility] patient and this is my first time meeting her.   Discussed the use of AI scribe software for clinical note transcription with the patient, who gave verbal consent to proceed.  History of Present Illness Erin Hudson is a 38 year old female with rhabdomyolysis who presents with recurrent arm swelling and pain.  She experiences recurrent arm swelling and pain, similar to her episode in May when diagnosed with rhabdomyolysis. Symptoms began a couple of weeks ago, with swelling particularly in the muscles, despite not engaging in strenuous activities. In May, her arms swelled without leg involvement, and ultrasounds ruled out blood clots. Her CK levels were extremely high, and liver enzymes were elevated. She feels very tired and nauseous recently. She is on oral birth control and is asthmatic. 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   Review of Systems  Constitutional:  Negative for chills and fever.  Respiratory:  Negative for shortness of breath.   Cardiovascular:  Negative for leg swelling.  Musculoskeletal:  Positive for extremity weakness and myalgias. Negative for joint pain.  Neurological:  Negative for tingling.        Objective:    BP 124/68   Pulse 91   Temp 98.6 F (37 C)   Resp 18   Ht 5' 10 (1.778 m)   Wt 199 lb (90.3 kg)   LMP 10/04/2023   SpO2 99%   BMI 28.55 kg/m    Physical Exam Constitutional:      Appearance: Normal appearance.  HENT:     Head: Normocephalic and atraumatic.  Eyes:      Conjunctiva/sclera: Conjunctivae normal.  Cardiovascular:     Rate and Rhythm: Normal rate and regular rhythm.  Pulmonary:     Effort: Pulmonary effort is normal.     Breath sounds: Normal breath sounds.  Musculoskeletal:        General: Swelling present. No tenderness.     Right lower leg: No edema.     Left lower leg: No edema.     Comments: Mild swelling along right biceps muscle into forearm, no pain, skin changes or BUE weakness on exam  Skin:    General: Skin is warm and dry.  Neurological:     General: No focal deficit present.     Mental Status: She is alert. Mental status is at baseline.  Psychiatric:        Mood and Affect: Mood normal.        Behavior: Behavior normal.     No results found for any visits on 10/18/23.      Assessment & Plan:   Assessment & Plan Hx of Recent Episode of Rhabdomyolysis Recurrent arm swelling with history of elevated CK levels in May, last level checked 6/25 147. No recent trauma or increased physical activity. No other symptoms. Risk of thrombosis due to oral contraceptives, had negative US 's while hospitalized in May. Concern for kidney damage from muscle breakdown products. - Order stat labs for  kidney function and CK levels at the hospital. - Encourage oral hydration. - Consider upper extremity ultrasounds based on lab results.    - CK; Future - Comprehensive Metabolic Panel (CMET) - CBC w/Diff/Platelet   Return if symptoms worsen or fail to improve.  Sharyle Fischer, DO

## 2023-10-19 ENCOUNTER — Ambulatory Visit: Payer: Self-pay | Admitting: Internal Medicine

## 2023-10-19 ENCOUNTER — Encounter: Payer: Self-pay | Admitting: Internal Medicine

## 2023-10-29 ENCOUNTER — Other Ambulatory Visit: Payer: Self-pay | Admitting: Family Medicine

## 2023-10-29 DIAGNOSIS — J4521 Mild intermittent asthma with (acute) exacerbation: Secondary | ICD-10-CM

## 2023-11-02 ENCOUNTER — Encounter: Payer: Self-pay | Admitting: Family Medicine

## 2023-12-04 ENCOUNTER — Other Ambulatory Visit: Payer: Self-pay | Admitting: Family Medicine

## 2023-12-04 ENCOUNTER — Encounter: Payer: Self-pay | Admitting: Family Medicine

## 2023-12-04 ENCOUNTER — Ambulatory Visit: Payer: Self-pay | Admitting: Family Medicine

## 2023-12-04 ENCOUNTER — Other Ambulatory Visit
Admission: RE | Admit: 2023-12-04 | Discharge: 2023-12-04 | Disposition: A | Discharge status: Home / Self Care | Payer: Self-pay | Source: Ambulatory Visit | Attending: Family Medicine | Admitting: Family Medicine

## 2023-12-04 VITALS — BP 126/81 | HR 64 | Temp 98.4°F | Ht 71.0 in | Wt 197.2 lb

## 2023-12-04 DIAGNOSIS — M7989 Other specified soft tissue disorders: Secondary | ICD-10-CM | POA: Insufficient documentation

## 2023-12-04 DIAGNOSIS — M791 Myalgia, unspecified site: Secondary | ICD-10-CM

## 2023-12-04 DIAGNOSIS — Z1159 Encounter for screening for other viral diseases: Secondary | ICD-10-CM | POA: Insufficient documentation

## 2023-12-04 DIAGNOSIS — J45909 Unspecified asthma, uncomplicated: Secondary | ICD-10-CM

## 2023-12-04 LAB — HEPATITIS C ANTIBODY: HCV Ab: NONREACTIVE

## 2023-12-04 MED ORDER — MELOXICAM 7.5 MG PO TABS: 7.5000 mg | ORAL_TABLET | Freq: Every day | ORAL | 0 refills | Status: AC

## 2023-12-04 NOTE — Progress Notes (Signed)
 Established patient visit   Patient: Erin Hudson   DOB: Oct 01, 1985   38 y.o. Female  MRN: [id number] Visit Date: 12/04/2023  Today's healthcare provider: Rockie Agent, MD   Chief Complaint  Patient presents with   Establish Care    Patient presents to establish care with new pcp.  Would like to follow up from last OV at [facility] 7/23- arm swelling/ pain. Patient reports she is still having swelling and pain now in both arms.    Subjective     HPI     Establish Care    Additional comments: Patient presents to establish care with new pcp.  Would like to follow up from last OV at [facility] 7/23- arm swelling/ pain. Patient reports she is still having swelling and pain now in both arms.       Last edited by Cherry Chiquita HERO, CMA on 12/04/2023 10:10 AM.       Discussed the use of AI scribe software for clinical note transcription with the patient, who gave verbal consent to proceed.  History of Present Illness Erin Hudson is a 38 year old female who presents with bilateral arm swelling.  She has been experiencing persistent swelling in both arms, which she first noticed on a Thursday or Friday. 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 The swelling fluctuates, sometimes improving, and is not associated with any recent trauma, surgeries, or injections.  Initially, she experienced arm pain described as feeling like a blood pressure cuff tightening, but she currently reports no pain. There is no swelling or pain in other areas of her body, and her breathing remains normal. She has not experienced any recent changes in diet or new supplements.  Her past medical history includes severe rhabdomyolysis, which led to a hospital admission, and vitamin D deficiency. She is currently taking orthocycline 0.25-35 mg-mcg daily for dysmenorrhea and contraception management. She also uses albuterol  via both  nebulizer and inhaler for asthma management.  She denies any recent surgeries, procedures, or injections in her arms. She has not experienced any other muscle aches or swelling elsewhere. She has not been working out due to previous rhabdomyolysis and has canceled her gym membership. She has not taken any new medications or supplements recently.  In terms of medication, she has taken Tylenol  for pain but is unsure of its effectiveness. She is not currently on potassium or vomiting medication as she was not filled post-discharge from the hospital. She does not have an EpiPen  due to cost, despite a history of allergic reactions, but she does have albuterol  for asthma management.  No abdominal pain, particularly in the right upper quadrant, and no issues with food tolerance or vomiting. No facial swelling or other systemic symptoms.     Past Medical History:  Diagnosis Date   Allergy    Asthma     Medications: Outpatient Medications Prior to Visit  Medication Sig   acetaminophen  (TYLENOL ) 500 MG tablet Take 1,000 mg by mouth every 6 (six) hours as needed for mild pain (pain score 1-3) or headache.   albuterol  (PROVENTIL ) (2.5 MG/3ML) 0.083% nebulizer solution USE 1 VIAL BY NEBULIZATION EVERY 6 HOURS AS NEEDED FOR WHEEZING OR SHORTNES OF BREATH   albuterol  (VENTOLIN  HFA) 108 (90 Base) MCG/ACT inhaler INHALE 2 PUFFS INTO THE LUNGS EVERY 4 HOURS AS NEEDED FOR WHEEZING OR SHORTNESS OF BREATH.   EPINEPHrine  0.3 mg/0.3 mL  IJ SOAJ injection Inject 0.3 mg into the muscle as needed for anaphylaxis.   norgestimate -ethinyl estradiol  (ORTHO-CYCLEN) 0.25-35 MG-MCG tablet Take 1 tablet by mouth daily.   [DISCONTINUED] potassium chloride  SA (KLOR-CON  M) 20 MEQ tablet Take 1 tablet (20 mEq total) by mouth daily for 5 days.   [DISCONTINUED] promethazine -dextromethorphan (PROMETHAZINE -DM) 6.25-15 MG/5ML syrup Take 5 mLs by mouth 4 (four) times daily as needed.   No facility-administered medications prior to  visit.    Review of Systems  Last metabolic panel Lab Results  Component Value Date   GLUCOSE 63 (L) 09/04/2023   NA 144 09/04/2023   K 4.2 09/04/2023   CL 106 09/04/2023   CO2 20 09/04/2023   BUN 9 09/04/2023   CREATININE 0.89 09/04/2023   EGFR 87 09/04/2023   CALCIUM 9.9 09/04/2023   PHOS 3.4 08/22/2023   PROT 6.9 09/04/2023   ALBUMIN 4.5 09/04/2023   LABGLOB 2.4 09/04/2023   BILITOT <0.2 09/04/2023   ALKPHOS 57 09/04/2023   AST 16 09/04/2023   ALT 13 09/04/2023   ANIONGAP 7 08/22/2023   Last lipids Lab Results  Component Value Date   CHOL 141 09/23/2011   HDL 71 (A) 09/23/2011   LDLCALC 59 09/23/2011   TRIG 57 09/23/2011   Last hemoglobin A1c No results found for: HGBA1C Last thyroid functions No results found for: TSH, T3TOTAL, T4TOTAL, THYROIDAB      Objective    BP 126/81 (BP Location: Left Arm, Patient Position: Sitting, Cuff Size: Normal)   Pulse 64   Temp 98.4 F (36.9 C) (Oral)   Ht 5' 11 (1.803 m)   Wt 197 lb 3.2 oz (89.4 kg)   LMP 11/06/2023 (Approximate)   SpO2 100%   BMI 27.50 kg/m  BP Readings from Last 3 Encounters:  12/04/23 126/81  10/18/23 124/68  09/04/23 128/66   Wt Readings from Last 3 Encounters:  12/04/23 197 lb 3.2 oz (89.4 kg)  10/18/23 199 lb (90.3 kg)  09/04/23 197 lb (89.4 kg)        Physical Exam  Physical Exam MUSCULOSKELETAL: No significant swelling or tenderness of bilateral biceps, tenderness along anterior biceps. 5/5 strength in bilateral upper extremities. No erythema or deformity of bilateral arms.    No results found for any visits on 12/04/23.  Assessment & Plan     Problem List Items Addressed This Visit     Arm swelling - Primary   Relevant Orders   Hepatitis C Antibody   ANA+ENA+DNA/DS+Scl 70+SjoSSA/B   US  LT UPPER EXTREM LTD SOFT TISSUE NON VASCULAR   US  RT UPPER EXTREM LTD SOFT TISSUE NON VASCULAR   Asthma, chronic   Other Visit Diagnoses       Myalgia       Relevant  Orders   Hepatitis C Antibody   ANA+ENA+DNA/DS+Scl 70+SjoSSA/B   US  LT UPPER EXTREM LTD SOFT TISSUE NON VASCULAR   US  RT UPPER EXTREM LTD SOFT TISSUE NON VASCULAR     Encounter for hepatitis C screening test for low risk patient       Relevant Orders   Hepatitis C Antibody       Assessment and Plan Assessment & Plan Bilateral upper arm swelling and pain Persistent bilateral upper arm swelling and pain, primarily during work activities involving lifting children. Previous ultrasound of the right upper extremity showed no evidence of DVT. No recent surgeries, procedures, or trauma to the arms. Differential diagnosis includes inflammatory processes, possible autoimmune conditions, or musculoskeletal issues. No significant swelling  or tenderness on examination, and strength is preserved. Possibility of an autoimmune process is considered, given the lack of clear etiology. - Order soft tissue ultrasound of both arms to assess for inflammation or fluid accumulation. - Order autoimmune panel to evaluate for potential autoimmune conditions. - Order thyroid function tests to rule out thyroid-related causes. - Recheck creatine kinase (CK) levels. - Prescribe meloxicam  7.5mg  every day for anti-inflammatory effects and pain management.  Asthma  Chronic  Asthma is well-controlled with no reported breathing difficulties. She has both an albuterol  inhaler and nebulizer available for use. - continue PRN albuterol    Dysmenorrhea and contraception management Chronic  Dysmenorrhea and contraception are managed with OCP - continue orthocycline 0.25-35 mg-mcg daily.  Follow-Up Follow-up is necessary to evaluate the results of the ordered tests and the effectiveness of the treatment plan. - Schedule follow-up appointment in six weeks to review test results and assess symptom progression.     Return in about 6 weeks (around 01/15/2024) for arm swelling .        Rockie Agent, MD   [facility] [phone] (phone) [phone] (fax)  [facility]

## 2023-12-05 ENCOUNTER — Ambulatory Visit: Payer: Self-pay | Admitting: Family Medicine

## 2023-12-05 ENCOUNTER — Ambulatory Visit
Admission: RE | Admit: 2023-12-05 | Discharge: 2023-12-05 | Disposition: A | Discharge status: Home / Self Care | Payer: Self-pay | Source: Ambulatory Visit | Attending: Family Medicine | Admitting: Family Medicine

## 2023-12-05 DIAGNOSIS — M791 Myalgia, unspecified site: Secondary | ICD-10-CM | POA: Insufficient documentation

## 2023-12-05 DIAGNOSIS — M7989 Other specified soft tissue disorders: Secondary | ICD-10-CM | POA: Insufficient documentation

## 2023-12-06 LAB — MISC LABCORP TEST (SEND OUT): Labcorp test code: 335931

## 2023-12-06 NOTE — Telephone Encounter (Signed)
 Have contacted [facility], the representative I spoke with was attempting to get the okay from a supervisor to add this test on- rep stated that since this lab was drawn at a hospital facility that we would not be able to add on CK level. I am awaiting call back from [facility] for confirmation that they are able to add this.

## 2023-12-06 NOTE — Telephone Encounter (Signed)
 Lab called back and they are able to add CK Total. They are faxing a form that needs to be signed and fax back ASAP.

## 2023-12-08 LAB — CK: Total CK: 191 U/L — AB (ref 32–182)

## 2023-12-08 LAB — SPECIMEN STATUS REPORT

## 2023-12-12 ENCOUNTER — Telehealth: Payer: Self-pay | Admitting: Physician Assistant

## 2023-12-12 DIAGNOSIS — J069 Acute upper respiratory infection, unspecified: Secondary | ICD-10-CM

## 2023-12-12 DIAGNOSIS — B9689 Other specified bacterial agents as the cause of diseases classified elsewhere: Secondary | ICD-10-CM

## 2023-12-12 DIAGNOSIS — J4521 Mild intermittent asthma with (acute) exacerbation: Secondary | ICD-10-CM

## 2023-12-12 MED ORDER — AMOXICILLIN-POT CLAVULANATE 875-125 MG PO TABS: 1.0000 | ORAL_TABLET | Freq: Two times a day (BID) | ORAL | 0 refills | Status: DC

## 2023-12-12 MED ORDER — PREDNISONE 20 MG PO TABS: 40.0000 mg | ORAL_TABLET | Freq: Every day | ORAL | 0 refills | Status: DC

## 2023-12-12 MED ORDER — PROMETHAZINE-DM 6.25-15 MG/5ML PO SYRP: 5.0000 mL | ORAL_SOLUTION | Freq: Four times a day (QID) | ORAL | 0 refills | Status: DC | PRN

## 2023-12-12 NOTE — Progress Notes (Signed)
 Virtual Visit Consent   Erin Hudson, you are scheduled for a virtual visit with a [facility] provider today. Just as with appointments in the office, your consent must be obtained to participate. Your consent will be active for this visit and any virtual visit you may have with one of our providers in the next 365 days. If you have a MyChart account, a copy of this consent can be sent to you electronically.  As this is a virtual visit, video technology does not allow for your provider to perform a traditional examination. This may limit your provider's ability to fully assess your condition. If your provider identifies any concerns that need to be evaluated in person or the need to arrange testing (such as labs, EKG, etc.), we will make arrangements to do so. Although advances in technology are sophisticated, we cannot ensure that it will always work on either your end or our end. If the connection with a video visit is poor, the visit may have to be switched to a telephone visit. With either a video or telephone visit, we are not always able to ensure that we have a secure connection.  By engaging in this virtual visit, you consent to the provision of healthcare and authorize for your insurance to be billed (if applicable) for the services provided during this visit. Depending on your insurance coverage, you may receive a charge related to this service.  I need to obtain your verbal consent now. Are you willing to proceed with your visit today? Erin Hudson has provided verbal consent on 12/12/2023 for a virtual visit (video or telephone). Erin Erin Dickinson, PA-C  Date: 12/12/2023 7:20 PM   Virtual Visit via Video Note   I, Erin Hudson, connected with  Erin Hudson  ([id number], Jun 01, 1985) on 12/12/23 at  7:15 PM EDT by a video-enabled telemedicine application and verified that I am speaking with the correct person using two identifiers.  Location: Patient: Virtual Visit Location  Patient: Home Provider: Virtual Visit Location Provider: Home Office   I discussed the limitations of evaluation and management by telemedicine and the availability of in person appointments. The patient expressed understanding and agreed to proceed.    History of Present Illness: Erin Hudson is a 38 y.o. who identifies as a female who was assigned female at birth, and is being seen today for cough and congestion.  HPI: URI  This is a new problem. The current episode started in the past 7 days (Sunday, 12/10/23). The problem has been gradually worsening. There has been no fever. Associated symptoms include chest pain (from coughing), congestion, coughing, a sore throat and wheezing. Pertinent negatives include no diarrhea, ear pain, headaches, nausea, plugged ear sensation, rhinorrhea, sinus pain or vomiting. She has tried inhaler use (Mucinex) for the symptoms. The treatment provided no relief.  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  PMH: Asthma   Problems:  Patient Active Problem List   Diagnosis Date Noted   Arm swelling 12/04/2023   Rhabdomyolysis 08/18/2023   Transaminitis 08/18/2023   Encounter for female birth control 06/13/2023   Chronic nonintractable headache 06/13/2023   Overweight 06/13/2023   Annual physical exam 06/29/2022   Screening for cervical cancer 06/29/2022   Establishing care with new doctor, encounter for 04/14/2022   Allergic rhinitis 06/02/2015   Asthma, chronic 06/02/2015   Contraception management 06/02/2015   Dysmenorrhea 06/02/2015   Irregular menstrual cycle 06/02/2015   Vitamin D deficiency 11/27/2008   Acne 07/24/2008  Pityriasis rosea 07/24/2008   CD (contact dermatitis) 07/16/2008    Allergies:  Allergies  Allergen Reactions   Azithromycin  Nausea Only and Other (See Comments)    Abdominal pain, dizziness   Medications:  Current Outpatient Medications:    amoxicillin -clavulanate (AUGMENTIN ) 875-125 MG tablet, Take 1 tablet by mouth 2 (two) times  daily., Disp: 14 tablet, Rfl: 0   predniSONE  (DELTASONE ) 20 MG tablet, Take 2 tablets (40 mg total) by mouth daily with breakfast., Disp: 14 tablet, Rfl: 0   promethazine -dextromethorphan (PROMETHAZINE -DM) 6.25-15 MG/5ML syrup, Take 5 mLs by mouth 4 (four) times daily as needed., Disp: 118 mL, Rfl: 0   acetaminophen  (TYLENOL ) 500 MG tablet, Take 1,000 mg by mouth every 6 (six) hours as needed for mild pain (pain score 1-3) or headache., Disp: , Rfl:    albuterol  (PROVENTIL ) (2.5 MG/3ML) 0.083% nebulizer solution, USE 1 VIAL BY NEBULIZATION EVERY 6 HOURS AS NEEDED FOR WHEEZING OR SHORTNES OF BREATH, Disp: 75 mL, Rfl: 0   albuterol  (VENTOLIN  HFA) 108 (90 Base) MCG/ACT inhaler, INHALE 2 PUFFS INTO THE LUNGS EVERY 4 HOURS AS NEEDED FOR WHEEZING OR SHORTNESS OF BREATH., Disp: 18 each, Rfl: 1   EPINEPHrine  0.3 mg/0.3 mL IJ SOAJ injection, Inject 0.3 mg into the muscle as needed for anaphylaxis., Disp: 1 each, Rfl: 0   meloxicam  (MOBIC ) 7.5 MG tablet, Take 1 tablet (7.5 mg total) by mouth daily., Disp: 30 tablet, Rfl: 0   norgestimate -ethinyl estradiol  (ORTHO-CYCLEN) 0.25-35 MG-MCG tablet, Take 1 tablet by mouth daily., Disp: 28 tablet, Rfl: 11  Observations/Objective: Patient is well-developed, well-nourished in no acute distress.  Resting comfortably at home.  Head is normocephalic, atraumatic.  No labored breathing.  Speech is clear and coherent with logical content.  Patient is alert and oriented at baseline.    Assessment and Plan: 1. Bacterial upper respiratory infection (Primary) - predniSONE  (DELTASONE ) 20 MG tablet; Take 2 tablets (40 mg total) by mouth daily with breakfast.  Dispense: 14 tablet; Refill: 0 - amoxicillin -clavulanate (AUGMENTIN ) 875-125 MG tablet; Take 1 tablet by mouth 2 (two) times daily.  Dispense: 14 tablet; Refill: 0 - promethazine -dextromethorphan (PROMETHAZINE -DM) 6.25-15 MG/5ML syrup; Take 5 mLs by mouth 4 (four) times daily as needed.  Dispense: 118 mL; Refill:  0  2. Mild intermittent asthma with exacerbation - predniSONE  (DELTASONE ) 20 MG tablet; Take 2 tablets (40 mg total) by mouth daily with breakfast.  Dispense: 14 tablet; Refill: 0 - amoxicillin -clavulanate (AUGMENTIN ) 875-125 MG tablet; Take 1 tablet by mouth 2 (two) times daily.  Dispense: 14 tablet; Refill: 0 - promethazine -dextromethorphan (PROMETHAZINE -DM) 6.25-15 MG/5ML syrup; Take 5 mLs by mouth 4 (four) times daily as needed.  Dispense: 118 mL; Refill: 0  - Worsening despite OTC medications - Will treat with Augmentin , Prednisone , and Promethazine  DM - Can continue Mucinex  - Continue inhalers and nebulizer as prescribed - Push fluids.  - Rest.  - Steam and humidifier can help - Seek in person evaluation if worsening or symptoms fail to improve    Follow Up Instructions: I discussed the assessment and treatment plan with the patient. The patient was provided an opportunity to ask questions and all were answered. The patient agreed with the plan and demonstrated an understanding of the instructions.  A copy of instructions were sent to the patient via MyChart unless otherwise noted below.    The patient was advised to call back or seek an in-person evaluation if the symptoms worsen or if the condition fails to improve as anticipated.  Erin Erin Dickinson, PA-C

## 2023-12-12 NOTE — Patient Instructions (Signed)
 Erin Hudson, thank you for joining Delon CHRISTELLA Dickinson, PA-C for today's virtual visit.  While this provider is not your primary care provider (PCP), if your PCP is located in our provider database this encounter information will be shared with them immediately following your visit.   A [facility] MyChart account gives you access to today's visit and all your visits, tests, and labs performed at [facility]  click here if you don't have a [facility] MyChart account or go to [email or web address]  Consent: (Patient) Erin Hudson provided verbal consent for this virtual visit at the beginning of the encounter.  Current Medications:  Current Outpatient Medications:    amoxicillin -clavulanate (AUGMENTIN ) 875-125 MG tablet, Take 1 tablet by mouth 2 (two) times daily., Disp: 14 tablet, Rfl: 0   predniSONE  (DELTASONE ) 20 MG tablet, Take 2 tablets (40 mg total) by mouth daily with breakfast., Disp: 14 tablet, Rfl: 0   promethazine -dextromethorphan (PROMETHAZINE -DM) 6.25-15 MG/5ML syrup, Take 5 mLs by mouth 4 (four) times daily as needed., Disp: 118 mL, Rfl: 0   acetaminophen  (TYLENOL ) 500 MG tablet, Take 1,000 mg by mouth every 6 (six) hours as needed for mild pain (pain score 1-3) or headache., Disp: , Rfl:    albuterol  (PROVENTIL ) (2.5 MG/3ML) 0.083% nebulizer solution, USE 1 VIAL BY NEBULIZATION EVERY 6 HOURS AS NEEDED FOR WHEEZING OR SHORTNES OF BREATH, Disp: 75 mL, Rfl: 0   albuterol  (VENTOLIN  HFA) 108 (90 Base) MCG/ACT inhaler, INHALE 2 PUFFS INTO THE LUNGS EVERY 4 HOURS AS NEEDED FOR WHEEZING OR SHORTNESS OF BREATH., Disp: 18 each, Rfl: 1   EPINEPHrine  0.3 mg/0.3 mL IJ SOAJ injection, Inject 0.3 mg into the muscle as needed for anaphylaxis., Disp: 1 each, Rfl: 0   meloxicam  (MOBIC ) 7.5 MG tablet, Take 1 tablet (7.5 mg total) by mouth daily., Disp: 30 tablet, Rfl: 0   norgestimate -ethinyl estradiol  (ORTHO-CYCLEN) 0.25-35 MG-MCG tablet, Take 1 tablet by mouth daily., Disp: 28  tablet, Rfl: 11   Medications ordered in this encounter:  Meds ordered this encounter  Medications   predniSONE  (DELTASONE ) 20 MG tablet    Sig: Take 2 tablets (40 mg total) by mouth daily with breakfast.    Dispense:  14 tablet    Refill:  0    Supervising Provider:   LAMPTEY, PHILIP O [[id number]]   amoxicillin -clavulanate (AUGMENTIN ) 875-125 MG tablet    Sig: Take 1 tablet by mouth 2 (two) times daily.    Dispense:  14 tablet    Refill:  0    Supervising Provider:   LAMPTEY, PHILIP O [[id number]]   promethazine -dextromethorphan (PROMETHAZINE -DM) 6.25-15 MG/5ML syrup    Sig: Take 5 mLs by mouth 4 (four) times daily as needed.    Dispense:  118 mL    Refill:  0    Supervising Provider:   BLAISE ALEENE KIDD [[id number]]     *If you need refills on other medications prior to your next appointment, please contact your pharmacy*  Follow-Up: Call back or seek an in-person evaluation if the symptoms worsen or if the condition fails to improve as anticipated.  [facility] [phone]  Other Instructions  Asthma, Adult  Asthma is a long-term (chronic) condition that causes recurrent episodes in which the lower airways in the lungs become tight and narrow. The narrowing is caused by inflammation and tightening of the smooth muscle around the lower airways. Asthma episodes, also called asthma attacks or asthma flares, may cause coughing, making high-pitched whistling sounds when you  breathe, most often when you breathe out (wheezing), shortness of breath, and chest pain. The airways may produce extra mucus caused by the inflammation and irritation. During an attack, it can be difficult to breathe. Asthma attacks can range from minor to life-threatening. Asthma cannot be cured, but medicines and lifestyle changes can help control it and treat acute attacks. It is important to keep your asthma well controlled so the condition does not interfere with your daily life. What are the  causes? This condition is believed to be caused by inherited (genetic) and environmental factors, but its exact cause is not known. What can trigger an asthma attack? Many things can bring on an asthma attack or make symptoms worse. These triggers are different for every person. Common triggers include: Allergens and irritants like mold, dust, pet dander, cockroaches, pollen, air pollution, and chemical odors. Cigarette smoke. Weather changes and cold air. Stress and strong emotional responses such as crying or laughing hard. Certain medications such as aspirin or beta blockers. Infections and inflammatory conditions, such as the flu, a cold, pneumonia, or inflammation of the nasal membranes (rhinitis). Gastroesophageal reflux disease (GERD). What are the signs or symptoms? Symptoms may occur right after exposure to an asthma trigger or hours later and can vary by person. Common signs and symptoms include: Wheezing. Trouble breathing (shortness of breath). Excessive nighttime or early morning coughing. Chest tightness. Tiredness (fatigue) with minimal activity. Difficulty talking in complete sentences. Poor exercise tolerance. How is this diagnosed? This condition is diagnosed based on: A physical exam and your medical history. Tests, which may include: Lung function studies to evaluate the flow of air in your lungs. Allergy tests. Imaging tests, such as X-rays. How is this treated? There is no cure, but symptoms can be controlled with proper treatment. Treatment usually involves: Identifying and avoiding your asthma triggers. Inhaled medicines. Two types are commonly used to treat asthma, depending on severity: Controller medicines. These help prevent asthma symptoms from occurring. They are taken every day. Fast-acting reliever or rescue medicines. These quickly relieve asthma symptoms. They are used as needed and provide short-term relief. Using other medicines, such as: Allergy  medicines, such as antihistamines, if your asthma attacks are triggered by allergens. Immune medicines (immunomodulators). These are medicines that help control the immune system. Using supplemental oxygen. This is only needed during a severe episode. Creating an asthma action plan. An asthma action plan is a written plan for managing and treating your asthma attacks. This plan includes: A list of your asthma triggers and how to avoid them. Information about when medicines should be taken and when their dosage should be changed. Instructions about using a device called a peak flow meter. A peak flow meter measures how well the lungs are working and the severity of your asthma. It helps you monitor your condition. Follow these instructions at home: Take over-the-counter and prescription medicines only as told by your health care provider. Stay up to date on all vaccinations as recommended by your healthcare provider, including vaccines for the flu and pneumonia. Use a peak flow meter and keep track of your peak flow readings. Understand and use your asthma action plan to address any asthma flares. Do not smoke or allow anyone to smoke in your home. Contact a health care provider if: You have wheezing, shortness of breath, or a cough that is not responding to medicines. Your medicines are causing side effects, such as a rash, itching, swelling, or trouble breathing. You need to use a  reliever medicine more than 2-3 times a week. Your peak flow reading is still at 50-79% of your personal best after following your action plan for 1 hour. You have a fever and shortness of breath. Get help right away if: You are getting worse and do not respond to treatment during an asthma attack. You are short of breath when at rest or when doing very little physical activity. You have difficulty eating, drinking, or talking. You have chest pain or tightness. You develop a fast heartbeat or palpitations. You  have a bluish color to your lips or fingernails. You are light-headed or dizzy, or you faint. Your peak flow reading is less than 50% of your personal best. You feel too tired to breathe normally. These symptoms may be an emergency. Get help right away. Call 911. Do not wait to see if the symptoms will go away. Do not drive yourself to the hospital. Summary Asthma is a long-term (chronic) condition that causes recurrent episodes in which the airways become tight and narrow. Asthma episodes, also called asthma attacks or asthma flares, can cause coughing, wheezing, shortness of breath, and chest pain. Asthma cannot be cured, but medicines and lifestyle changes can help keep it well controlled and prevent asthma flares. Make sure you understand how to avoid triggers and how and when to use your medicines. Asthma attacks can range from minor to life-threatening. Get help right away if you have an asthma attack and do not respond to treatment with your usual rescue medicines. This information is not intended to replace advice given to you by your health care provider. Make sure you discuss any questions you have with your health care provider. Document Revised: 12/30/2020 Document Reviewed: 12/21/2020 Elsevier Patient Education  2024 [employer]vier Inc.   If you have been instructed to have an in-person evaluation today at a local Urgent Care facility, please use the link below. It will take you to a list of all of our available [facility], including address, phone number and hours of operation. Please do not delay care.  [facility]  If you or a family member do not have a primary care provider, use the link below to schedule a visit and establish care. When you choose a [facility] primary care physician or advanced practice provider, you gain a long-term partner in health. Find a Primary Care Provider  Learn more about [facility]'s in-office and virtual care  options: [facility] Get Care Now

## 2023-12-13 ENCOUNTER — Ambulatory Visit: Payer: Self-pay | Admitting: Family Medicine

## 2024-01-23 ENCOUNTER — Other Ambulatory Visit: Payer: Self-pay | Admitting: Medical Genetics

## 2024-01-30 ENCOUNTER — Other Ambulatory Visit
Admission: RE | Admit: 2024-01-30 | Discharge: 2024-01-30 | Disposition: A | Discharge status: Home / Self Care | Payer: Self-pay | Attending: Family Medicine | Admitting: Family Medicine

## 2024-01-30 ENCOUNTER — Encounter: Payer: Self-pay | Admitting: Family Medicine

## 2024-01-30 ENCOUNTER — Other Ambulatory Visit: Payer: Self-pay

## 2024-01-30 ENCOUNTER — Ambulatory Visit: Payer: Self-pay | Admitting: Family Medicine

## 2024-01-30 ENCOUNTER — Ambulatory Visit (INDEPENDENT_AMBULATORY_CARE_PROVIDER_SITE_OTHER): Payer: Self-pay | Admitting: Family Medicine

## 2024-01-30 VITALS — BP 136/77 | HR 89 | Temp 99.1°F | Ht 71.0 in | Wt 201.7 lb

## 2024-01-30 DIAGNOSIS — M791 Myalgia, unspecified site: Secondary | ICD-10-CM

## 2024-01-30 DIAGNOSIS — E559 Vitamin D deficiency, unspecified: Secondary | ICD-10-CM

## 2024-01-30 DIAGNOSIS — R5383 Other fatigue: Secondary | ICD-10-CM | POA: Insufficient documentation

## 2024-01-30 DIAGNOSIS — R7401 Elevation of levels of liver transaminase levels: Secondary | ICD-10-CM | POA: Insufficient documentation

## 2024-01-30 DIAGNOSIS — J4520 Mild intermittent asthma, uncomplicated: Secondary | ICD-10-CM

## 2024-01-30 DIAGNOSIS — J45909 Unspecified asthma, uncomplicated: Secondary | ICD-10-CM

## 2024-01-30 LAB — TSH: TSH: 0.709 u[IU]/mL (ref 0.350–4.500)

## 2024-01-30 LAB — CBC WITH DIFFERENTIAL/PLATELET
Abs Immature Granulocytes: 0.02 K/uL (ref 0.00–0.07)
Basophils Absolute: 0 K/uL (ref 0.0–0.1)
Basophils Relative: 0 %
Eosinophils Absolute: 0.2 K/uL (ref 0.0–0.5)
Eosinophils Relative: 3 %
HCT: 41.6 % (ref 36.0–46.0)
Hemoglobin: 13.5 g/dL (ref 12.0–15.0)
Immature Granulocytes: 0 %
Lymphocytes Relative: 38 %
Lymphs Abs: 2.9 K/uL (ref 0.7–4.0)
MCH: 26.7 pg (ref 26.0–34.0)
MCHC: 32.5 g/dL (ref 30.0–36.0)
MCV: 82.2 fL (ref 80.0–100.0)
Monocytes Absolute: 0.6 K/uL (ref 0.1–1.0)
Monocytes Relative: 8 %
Neutro Abs: 3.9 K/uL (ref 1.7–7.7)
Neutrophils Relative %: 51 %
Platelets: 240 K/uL (ref 150–400)
RBC: 5.06 MIL/uL (ref 3.87–5.11)
RDW: 12.2 % (ref 11.5–15.5)
WBC: 7.7 K/uL (ref 4.0–10.5)
nRBC: 0 % (ref 0.0–0.2)

## 2024-01-30 LAB — COMPREHENSIVE METABOLIC PANEL WITH GFR
ALT: 13 U/L (ref 0–44)
AST: 17 U/L (ref 15–41)
Albumin: 3.4 g/dL — ABNORMAL LOW (ref 3.5–5.0)
Alkaline Phosphatase: 44 U/L (ref 38–126)
Anion gap: 9 (ref 5–15)
BUN: 11 mg/dL (ref 6–20)
CO2: 26 mmol/L (ref 22–32)
Calcium: 8.8 mg/dL — ABNORMAL LOW (ref 8.9–10.3)
Chloride: 103 mmol/L (ref 98–111)
Creatinine, Ser: 0.79 mg/dL (ref 0.44–1.00)
GFR, Estimated: >60 mL/min (ref >60)
Glucose, Bld: 105 mg/dL — ABNORMAL HIGH (ref 70–99)
Potassium: 3.7 mmol/L (ref 3.5–5.1)
Sodium: 138 mmol/L (ref 135–145)
Total Bilirubin: 0.3 mg/dL (ref 0.0–1.2)
Total Protein: 7.3 g/dL (ref 6.5–8.1)

## 2024-01-30 LAB — C-REACTIVE PROTEIN: CRP: 0.6 mg/dL (ref <1.0)

## 2024-01-30 LAB — CK: Total CK: 95 U/L (ref 38–234)

## 2024-01-30 LAB — SEDIMENTATION RATE: Sed Rate: 7 mm/h (ref 0–20)

## 2024-01-30 LAB — VITAMIN D 25 HYDROXY (VIT D DEFICIENCY, FRACTURES): Vit D, 25-Hydroxy: 18.96 ng/mL — ABNORMAL LOW (ref 30–100)

## 2024-01-30 LAB — T4, FREE: Free T4: 0.79 ng/dL (ref 0.61–1.12)

## 2024-01-30 MED ORDER — ALBUTEROL SULFATE HFA 108 (90 BASE) MCG/ACT IN AERS
2.0000 | INHALATION_SPRAY | RESPIRATORY_TRACT | 1 refills | Status: AC | PRN
Start: 2024-01-30 — End: ?

## 2024-01-30 MED ORDER — ALBUTEROL SULFATE (2.5 MG/3ML) 0.083% IN NEBU: INHALATION_SOLUTION | RESPIRATORY_TRACT | 0 refills | Status: AC

## 2024-01-30 NOTE — Assessment & Plan Note (Signed)
  Vitamin D deficiency Chronic  Potential vitamin D deficiency contributing to symptoms. No previous measurement of vitamin D levels. - Ordered vitamin D level

## 2024-01-30 NOTE — Assessment & Plan Note (Signed)
 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 Differential includes autoimmune conditions affecting muscles and connective tissues. No current evidence of medication-induced myalgias. Consideration of rheumatology referral for further evaluation and management. Discussed potential autoimmune antibodies and the role of rheumatology in identifying specific conditions and treatments. - Ordered autoimmune antibody panel including ANA, ENA, DNA, anti-SSA, and anti-SSB, RNP antibodies, ESR, CRP  - Ordered CK and liver enzyme tests - Ordered thyroid function tests - Ordered vitamin D level - will plan referral to rheumatology for further evaluation depending on results or symptom recurrence in absence of exercise

## 2024-01-30 NOTE — Patient Instructions (Signed)
 To keep you healthy, please keep in mind the following health maintenance items that you are due for:   Health Maintenance Due  Topic Date Due   Influenza Vaccine  10/27/2023     Best Wishes,   Dr. Lang

## 2024-01-30 NOTE — Assessment & Plan Note (Signed)
 Mild intermittent asthma chronic Symptoms of wheezing, coughing, and occasional dyspnea, exacerbated by weather changes. No current use of maintenance inhalers  - Refilled albuterol  nebulizer and inhaler as needed

## 2024-01-30 NOTE — Assessment & Plan Note (Signed)
  Elevated liver transaminases Previous elevated liver enzymes with ongoing monitoring needed to assess current status and potential relation to autoimmune myopathy. - Ordered liver enzyme tests

## 2024-01-30 NOTE — Progress Notes (Signed)
 Established patient visit   Patient: Erin Hudson   DOB: Feb 03, 1986   38 y.o. Female  MRN: [id number] Visit Date: 01/30/2024  Today's healthcare provider: Rockie Agent, MD   Chief Complaint  Patient presents with   Medical Management of Chronic Issues    Patient presents to follow up on chronic conditions, reports feeling well overall.  Patient reports some swelling occasionally but not nearly as bad as before. No acute concerns today    Subjective     HPI     Medical Management of Chronic Issues    Additional comments: Patient presents to follow up on chronic conditions, reports feeling well overall.  Patient reports some swelling occasionally but not nearly as bad as before. No acute concerns today       Last edited by Cherry Chiquita HERO, CMA on 01/30/2024  1:25 PM.       Discussed the use of AI scribe software for clinical note transcription with the patient, who gave verbal consent to proceed.  History of Present Illness Erin Hudson is a 38 year old female with mild asthma and rhabdomyolysis who presents for a chronic follow-up and medication refill.  She experiences mild asthma exacerbations triggered by weather changes and colds, resulting in wheezing, coughing, and occasional shortness of breath. She uses an albuterol  nebulizer as needed, particularly during weather fluctuations, with symptom improvement typically occurring over two to three days.  She has a history of elevated creatine kinase levels and rhabdomyolysis, with the last CK level recorded at 191. She reports swelling in both upper arms but denies weakness when lifting or pushing herself out of a chair. She felt extremely tired a couple of weekends ago and has been experiencing hair loss following a previous hospital admission. No dizziness, lightheadedness, or issues with climbing stairs. She has not noticed any connection between her symptoms and her diet.  Her current medications include  albuterol  for asthma. She has an allergy to azithromycin  but no known food allergies. She has not been on any steroids recently and denies any rashes.  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     Past Medical History:  Diagnosis Date   Allergy    Asthma     Medications: Outpatient Medications Prior to Visit  Medication Sig   acetaminophen  (TYLENOL ) 500 MG tablet Take 1,000 mg by mouth every 6 (six) hours as needed for mild pain (pain score 1-3) or headache.   meloxicam  (MOBIC ) 7.5 MG tablet Take 1 tablet (7.5 mg total) by mouth daily.   norgestimate -ethinyl estradiol  (ORTHO-CYCLEN) 0.25-35 MG-MCG tablet Take 1 tablet by mouth daily.   [DISCONTINUED] albuterol  (PROVENTIL ) (2.5 MG/3ML) 0.083% nebulizer solution USE 1 VIAL BY NEBULIZATION EVERY 6 HOURS AS NEEDED FOR WHEEZING OR SHORTNES OF BREATH   [DISCONTINUED] albuterol  (VENTOLIN  HFA) 108 (90 Base) MCG/ACT inhaler INHALE 2 PUFFS INTO THE LUNGS EVERY 4 HOURS AS NEEDED FOR WHEEZING OR SHORTNESS OF BREATH.   [DISCONTINUED] amoxicillin -clavulanate (AUGMENTIN ) 875-125 MG tablet Take 1 tablet by mouth 2 (two) times daily.   [DISCONTINUED] EPINEPHrine  0.3 mg/0.3 mL IJ SOAJ injection Inject 0.3 mg into the muscle as needed for anaphylaxis.   [DISCONTINUED] predniSONE  (DELTASONE ) 20 MG tablet Take 2 tablets (40 mg total) by mouth daily with breakfast.   [DISCONTINUED] promethazine -dextromethorphan (PROMETHAZINE -DM) 6.25-15 MG/5ML syrup Take 5 mLs by mouth 4 (four) times daily as needed.   No facility-administered medications prior to visit.    Review of Systems  Last CBC Lab Results  Component Value Date   WBC 7.7 01/30/2024   HGB 13.5 01/30/2024   HCT 41.6 01/30/2024   MCV 82.2 01/30/2024   MCH 26.7 01/30/2024   RDW 12.2 01/30/2024   PLT 240 01/30/2024   Last metabolic panel Lab Results  Component Value Date   GLUCOSE 105 (H) 01/30/2024   NA 138 01/30/2024   K 3.7 01/30/2024   CL 103  01/30/2024   CO2 26 01/30/2024   BUN 11 01/30/2024   CREATININE 0.79 01/30/2024   EGFR 87 09/04/2023   CALCIUM 8.8 (L) 01/30/2024   PHOS 3.4 08/22/2023   PROT 7.3 01/30/2024   ALBUMIN 3.4 (L) 01/30/2024   LABGLOB 2.4 09/04/2023   BILITOT 0.3 01/30/2024   ALKPHOS 44 01/30/2024   AST 17 01/30/2024   ALT 13 01/30/2024   ANIONGAP 9 01/30/2024   Last lipids Lab Results  Component Value Date   CHOL 141 09/23/2011   HDL 71 (A) 09/23/2011   LDLCALC 59 09/23/2011   TRIG 57 09/23/2011   Last hemoglobin A1c No results found for: HGBA1C Last thyroid functions Lab Results  Component Value Date   TSH 0.709 01/30/2024   FREET4 0.79 01/30/2024    Last vitamin D No results found for: 25OHVITD2, 25OHVITD3, VD25OH       Objective    BP 136/77 (BP Location: Right Arm, Patient Position: Sitting, Cuff Size: Normal)   Pulse 89   Temp 99.1 F (37.3 C) (Oral)   Ht 5' 11 (1.803 m)   Wt 201 lb 11.2 oz (91.5 kg)   LMP 01/01/2024 (Approximate)   SpO2 99%   BMI 28.13 kg/m   BP Readings from Last 3 Encounters:  01/30/24 136/77  12/04/23 126/81  10/18/23 124/68   Wt Readings from Last 3 Encounters:  01/30/24 201 lb 11.2 oz (91.5 kg)  12/04/23 197 lb 3.2 oz (89.4 kg)  10/18/23 199 lb (90.3 kg)        Physical Exam  Physical Exam CHEST: Clear to auscultation bilaterally, no wheezes, rhonchi, or crackles. ABDOMEN: Soft, non-tender. MUSCULOSKELETAL: Strength 5/5 in all extremities. No swelling noted on bilateral elbow joints, normal ROM of bilateral uppter extremities, no tenderness to palpation of biceps/triceps    Results for orders placed or performed during the hospital encounter of 01/30/24  CK  Result Value Ref Range   Total CK 95 38 - 234 U/L  Comprehensive metabolic panel  Result Value Ref Range   Sodium 138 135 - 145 mmol/L   Potassium 3.7 3.5 - 5.1 mmol/L   Chloride 103 98 - 111 mmol/L   CO2 26 22 - 32 mmol/L   Glucose, Bld 105 (H) 70 - 99 mg/dL    BUN 11 6 - 20 mg/dL   Creatinine, Ser 9.20 0.44 - 1.00 mg/dL   Calcium 8.8 (L) 8.9 - 10.3 mg/dL   Total Protein 7.3 6.5 - 8.1 g/dL   Albumin 3.4 (L) 3.5 - 5.0 g/dL   AST 17 15 - 41 U/L   ALT 13 0 - 44 U/L   Alkaline Phosphatase 44 38 - 126 U/L   Total Bilirubin 0.3 0.0 - 1.2 mg/dL   GFR, Estimated >39 >39 mL/min   Anion gap 9 5 - 15  TSH  Result Value Ref Range   TSH 0.709 0.350 - 4.500 uIU/mL  T4, free  Result Value Ref Range   Free T4 0.79 0.61 - 1.12 ng/dL  Sedimentation rate  Result Value Ref Range  Sed Rate 7 0 - 20 mm/hr  CBC with Differential/Platelet  Result Value Ref Range   WBC 7.7 4.0 - 10.5 K/uL   RBC 5.06 3.87 - 5.11 MIL/uL   Hemoglobin 13.5 12.0 - 15.0 g/dL   HCT 58.3 63.9 - 53.9 %   MCV 82.2 80.0 - 100.0 fL   MCH 26.7 26.0 - 34.0 pg   MCHC 32.5 30.0 - 36.0 g/dL   RDW 87.7 88.4 - 84.4 %   Platelets 240 150 - 400 K/uL   nRBC 0.0 0.0 - 0.2 %   Neutrophils Relative % 51 %   Neutro Abs 3.9 1.7 - 7.7 K/uL   Lymphocytes Relative 38 %   Lymphs Abs 2.9 0.7 - 4.0 K/uL   Monocytes Relative 8 %   Monocytes Absolute 0.6 0.1 - 1.0 K/uL   Eosinophils Relative 3 %   Eosinophils Absolute 0.2 0.0 - 0.5 K/uL   Basophils Relative 0 %   Basophils Absolute 0.0 0.0 - 0.1 K/uL   Immature Granulocytes 0 %   Abs Immature Granulocytes 0.02 0.00 - 0.07 K/uL    Assessment & Plan     Problem List Items Addressed This Visit     Asthma, chronic   Mild intermittent asthma chronic Symptoms of wheezing, coughing, and occasional dyspnea, exacerbated by weather changes. 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 Differential includes autoimmune conditions affecting muscles and connective tissues. No current evidence of medication-induced myalgias. Consideration of rheumatology referral for further evaluation and management. Discussed potential autoimmune antibodies and the role of rheumatology in identifying specific conditions and treatments. - Ordered autoimmune antibody panel including ANA, ENA, DNA, anti-SSA, and anti-SSB, RNP antibodies, ESR, CRP  - Ordered CK and liver enzyme tests - Ordered thyroid function tests - Ordered vitamin D level - will plan referral to rheumatology for further evaluation depending on results or symptom recurrence in absence of exercise        Relevant Orders   CK   CMP14+EGFR   TSH + free T4   ANA+ENA+DNA/DS+Scl 70+SjoSSA/B   Sed Rate (ESR)   C-reactive protein   CBC w/Diff/Platelet   Rheumatoid Factor   RNP Antibodies   Transaminitis    Elevated liver transaminases Previous elevated liver enzymes with ongoing monitoring needed to assess current status and potential relation to autoimmune myopathy. - Ordered liver enzyme tests      Relevant Orders   CMP14+EGFR   Vitamin D deficiency    Vitamin D deficiency Chronic  Potential vitamin D deficiency contributing to symptoms. No previous measurement of vitamin D levels. - Ordered vitamin D level      Relevant Orders   VITAMIN D 25 Hydroxy (Vit-D Deficiency, Fractures)   Other Visit Diagnoses       Mild intermittent asthma without complication       Relevant Medications   albuterol  (PROVENTIL ) (2.5 MG/3ML) 0.083% nebulizer solution   albuterol  (VENTOLIN  HFA) 108 (90 Base) MCG/ACT inhaler     Fatigue, unspecified type       Relevant Orders   Rheumatoid Factor   RNP Antibodies       Assessment  and Plan Assessment & Plan        Return if symptoms worsen or fail to improve.         Rockie Agent, MD  [facility] [phone] (phone) [phone] (fax)  [facility]

## 2024-01-31 LAB — MISC LABCORP TEST (SEND OUT)
Labcorp test code: 16353
Labcorp test code: 335931

## 2024-01-31 LAB — RHEUMATOID FACTOR: Rheumatoid fact SerPl-aCnc: <10 [IU]/mL (ref <14.0)

## 2024-01-31 MED ORDER — VITAMIN D (ERGOCALCIFEROL) 1.25 MG (50000 UNIT) PO CAPS
50000.0000 [IU] | ORAL_CAPSULE | ORAL | 1 refills | Status: AC
Start: 2024-01-31 — End: ?

## 2024-04-20 ENCOUNTER — Ambulatory Visit
Admission: EM | Admit: 2024-04-20 | Discharge: 2024-04-20 | Disposition: A | Discharge status: Home / Self Care | Payer: Self-pay | Attending: Nurse Practitioner | Admitting: Nurse Practitioner

## 2024-04-20 DIAGNOSIS — S61211A Laceration without foreign body of left index finger without damage to nail, initial encounter: Secondary | ICD-10-CM

## 2024-04-20 DIAGNOSIS — Z23 Encounter for immunization: Secondary | ICD-10-CM

## 2024-04-20 MED ORDER — TETANUS-DIPHTH-ACELL PERTUSSIS 5-2-15.5 LF-MCG/0.5 IM SUSP
0.5000 mL | Freq: Once | INTRAMUSCULAR | Status: AC
  Administered 2024-04-20: 0.5 mL via INTRAMUSCULAR

## 2024-04-20 NOTE — Discharge Instructions (Addendum)
 You were seen today for a cut, also called a laceration. A laceration is an injury that can go through all layers of the skin and sometimes into the tissue underneath. Some cuts can heal on their own, but others need to be closed to help them heal properly and lower the risk of infection. Your cut was treated with skin glue, which holds the skin together and helps the wound heal faster. The skin glue will fall off on its own as the wound heals, usually within 7 to 10 days. It is important to care for your wound carefully. Keep the dressing on for the next 24 hours and do not let it get wet. After 24 hours, you can remove the dressing but leave the skin glue in place. Do not scratch, rub, or pick at the glue. Avoid putting tape, ointments, creams, disinfectants, or liquid medicines on the wound, as these can interfere with healing. Please return here or follow up with your healthcare provider if you notice increased redness, swelling, pus, or worsening pain.

## 2024-04-20 NOTE — ED Triage Notes (Signed)
 Pt states she cut her left 2nd digit with a knife while cutting cucumbers. Small laceration noted bleeding controlled.

## 2024-04-20 NOTE — ED Provider Notes (Signed)
 " [facility]    CSN: [id number] Arrival date & time: 04/20/24  1345      History   Chief Complaint Chief Complaint  Patient presents with   Laceration    HPI Erin Hudson is a 39 y.o. female.   Discussed the use of AI scribe software for clinical note transcription with the patient, who gave verbal consent to proceed.   Erin Hudson presents today for evaluation of a laceration sustained while slicing cucumbers. The injury occurred today after 12 PM when she was cutting cucumbers for pasta salad preparation. She reports that when the injury initially occurred, it would not stop bleeding, though she is not on any blood thinners. The patient denies any numbness or tingling associated with the injury. Last TDAP was 10/2008.   The following sections of the patient's history were reviewed and updated as appropriate: allergies, current medications, past family history, past medical history, past social history, past surgical history, and problem list.           Past Medical History:  Diagnosis Date   Allergy    Asthma     Patient Active Problem List   Diagnosis Date Noted   Myalgia 01/30/2024   Arm swelling 12/04/2023   Transaminitis 08/18/2023   Encounter for female birth control 06/13/2023   Chronic nonintractable headache 06/13/2023   Overweight 06/13/2023   Annual physical exam 06/29/2022   Screening for cervical cancer 06/29/2022   Establishing care with new doctor, encounter for 04/14/2022   Allergic rhinitis 06/02/2015   Asthma, chronic 06/02/2015   Contraception management 06/02/2015   Dysmenorrhea 06/02/2015   Irregular menstrual cycle 06/02/2015   Vitamin D  deficiency 11/27/2008   Acne 07/24/2008   Pityriasis rosea 07/24/2008   CD (contact dermatitis) 07/16/2008    Past Surgical History:  Procedure Laterality Date   HERNIA REPAIR     INDUCED ABORTION  2022   pyloric stenosis     TONSILLECTOMY      OB History   No obstetric history on  file.      Home Medications    Prior to Admission medications  Medication Sig Start Date End Date Taking? Authorizing Provider  acetaminophen  (TYLENOL ) 500 MG tablet Take 1,000 mg by mouth every 6 (six) hours as needed for mild pain (pain score 1-3) or headache.    [provider]  albuterol  (PROVENTIL ) (2.5 MG/3ML) 0.083% nebulizer solution USE 1 VIAL BY NEBULIZATION EVERY 6 HOURS AS NEEDED FOR WHEEZING OR SHORTNES OF BREATH 01/30/24   Simmons-Robinson, Rockie, MD  albuterol  (VENTOLIN  HFA) 108 (90 Base) MCG/ACT inhaler Inhale 2 puffs into the lungs every 4 (four) hours as needed for wheezing or shortness of breath. 01/30/24   Simmons-Robinson, Makiera, MD  meloxicam  (MOBIC ) 7.5 MG tablet Take 1 tablet (7.5 mg total) by mouth daily. 12/04/23   Simmons-Robinson, Rockie, MD  norgestimate -ethinyl estradiol  (ORTHO-CYCLEN) 0.25-35 MG-MCG tablet Take 1 tablet by mouth daily. 06/13/23   Ostwalt, Janna, PA-C  Vitamin D , Ergocalciferol , (DRISDOL ) 1.25 MG (50000 UNIT) CAPS capsule Take 1 capsule (50,000 Units total) by mouth every 7 (seven) days. 01/31/24   Simmons-Robinson, Rockie, MD    Family History Family History  Problem Relation Age of Onset   Heart disease Mother    Hypertension Mother    Heart disease Father    Depression Father    Colon polyps Father    Healthy Sister     Social History Social History[1]   Allergies   Azithromycin    Review of  Systems Review of Systems  Skin:  Positive for wound.  Neurological:  Negative for numbness.  All other systems reviewed and are negative.    Physical Exam Triage Vital Signs ED Triage Vitals  Encounter Vitals Group     BP 04/20/24 1509 (!) 141/82     Girls Systolic BP Percentile --      Girls Diastolic BP Percentile --      Boys Systolic BP Percentile --      Boys Diastolic BP Percentile --      Pulse Rate 04/20/24 1509 79     Resp 04/20/24 1509 16     Temp 04/20/24 1509 98.9 F (37.2 C)     Temp Source 04/20/24  1509 Oral     SpO2 04/20/24 1509 97 %     Weight --      Height --      Head Circumference --      Peak Flow --      Pain Score 04/20/24 1508 3     Pain Loc --      Pain Education --      Exclude from Growth Chart --    No data found.  Updated Vital Signs BP (!) 141/82 (BP Location: Left Arm)   Pulse 79   Temp 98.9 F (37.2 C) (Oral)   Resp 16   LMP 03/30/2024 (Approximate)   SpO2 97%   Visual Acuity Right Eye Distance:   Left Eye Distance:   Bilateral Distance:    Right Eye Near:   Left Eye Near:    Bilateral Near:     Physical Exam Vitals reviewed.  Constitutional:      General: She is awake. She is not in acute distress.    Appearance: Normal appearance. She is well-developed. She is not ill-appearing, toxic-appearing or diaphoretic.  HENT:     Head: Normocephalic.     Right Ear: Hearing normal.     Left Ear: Hearing normal.     Nose: Nose normal.     Mouth/Throat:     Mouth: Mucous membranes are moist.  Eyes:     General: Vision grossly intact.     Conjunctiva/sclera: Conjunctivae normal.  Cardiovascular:     Rate and Rhythm: Normal rate and regular rhythm.     Heart sounds: Normal heart sounds.  Pulmonary:     Effort: Pulmonary effort is normal.     Breath sounds: Normal breath sounds and air entry.  Musculoskeletal:        General: Normal range of motion.     Left hand: Laceration present. No swelling, deformity or bony tenderness. Normal range of motion. Normal strength. Normal sensation. Normal capillary refill.     Cervical back: Normal range of motion and neck supple.     Comments: Laceration noted to the left index finger. No swelling or deformity observed, with full range of motion, normal strength, and intact sensation of the digit.  Skin:    General: Skin is warm and dry.     Findings: Laceration present.     Comments: See below   Neurological:     General: No focal deficit present.     Mental Status: She is alert and oriented to person,  place, and time.  Psychiatric:        Speech: Speech normal.        Behavior: Behavior is cooperative.     Approximately 1 cm linear laceration noted on the distal dorsal aspect of the left index  finger. No active bleeding, erythema, or swelling observed.    UC Treatments / Results  Labs (all labs ordered are listed, but only abnormal results are displayed) Labs Reviewed - No data to display  EKG   Radiology No results found.  Procedures Laceration Repair: L index finger  Date/Time: 04/20/2024 3:52 PM  Performed by: Iola Lukes, FNP Authorized by: Iola Lukes, FNP   Consent:    Consent obtained:  Verbal   Consent given by:  Patient   Risks, benefits, and alternatives were discussed: yes     Risks discussed:  Infection, pain and poor cosmetic result Universal protocol:    Patient identity confirmed:  Verbally with patient Anesthesia:    Anesthesia method:  None Laceration details:    Location:  Finger   Finger location:  L index finger   Length (cm):  1 Exploration:    Contaminated: no   Treatment:    Area cleansed with:  Chlorhexidine   Amount of cleaning:  Standard Skin repair:    Repair method:  Tissue adhesive Approximation:    Approximation:  Close Repair type:    Repair type:  Simple Post-procedure details:    Dressing:  Non-adherent dressing   Procedure completion:  Tolerated well, no immediate complications  (including critical care time)  Medications Ordered in UC Medications  Tdap (ADACEL) injection 0.5 mL (0.5 mLs Intramuscular Given 04/20/24 1552)    Initial Impression / Assessment and Plan / UC Course  I have reviewed the triage vital signs and the nursing notes.  Pertinent labs & imaging results that were available during my care of the patient were reviewed by me and considered in my medical decision making (see chart for details).     Patient evaluated for laceration to the left index finger status post repair with  dermabond .Examination revealed no evidence of tendon, nerve, or vascular injury. Tdap vaccination was updated today in clinic. Wound care instructions were reviewed, including keeping the area clean and dry, monitoring for signs of infection, and activity modifications to protect the repair. Patient advised to seek prompt care for increasing pain, swelling, redness, drainage, or fever.  Today's evaluation has revealed no signs of a dangerous process. Discussed diagnosis with patient and/or guardian. Patient and/or guardian aware of their diagnosis, possible red flag symptoms to watch out for and need for close follow up. Patient and/or guardian understands verbal and written discharge instructions. Patient and/or guardian comfortable with plan and disposition.  Patient and/or guardian has a clear mental status at this time, good insight into illness (after discussion and teaching) and has clear judgment to make decisions regarding their care  Documentation was completed with the aid of voice recognition software. Transcription may contain typographical errors.   Final Clinical Impressions(s) / UC Diagnoses   Final diagnoses:  Laceration of left index finger without foreign body without damage to nail, initial encounter     Discharge Instructions      You were seen today for a cut, also called a laceration. A laceration is an injury that can go through all layers of the skin and sometimes into the tissue underneath. Some cuts can heal on their own, but others need to be closed to help them heal properly and lower the risk of infection. Your cut was treated with skin glue, which holds the skin together and helps the wound heal faster. The skin glue will fall off on its own as the wound heals, usually within 7 to 10 days. It is  important to care for your wound carefully. Keep the dressing on for the next 24 hours and do not let it get wet. After 24 hours, you can remove the dressing but leave the skin  glue in place. Do not scratch, rub, or pick at the glue. Avoid putting tape, ointments, creams, disinfectants, or liquid medicines on the wound, as these can interfere with healing. Please return here or follow up with your healthcare provider if you notice increased redness, swelling, pus, or worsening pain.     ED Prescriptions   None    PDMP not reviewed this encounter.     [1]  Social History Tobacco Use   Smoking status: Never   Smokeless tobacco: Never  Vaping Use   Vaping status: Never Used  Substance Use Topics   Alcohol use: Yes    Comment: OCC   Drug use: No     Iola Lukes, FNP 04/20/24 1553  "

## 2024-05-07 ENCOUNTER — Ambulatory Visit: Payer: Self-pay | Admitting: Family Medicine

## 2024-07-04 ENCOUNTER — Ambulatory Visit: Payer: Self-pay | Admitting: Family Medicine
